# Patient Record
Sex: Male | Born: 1950 | Race: Black or African American | Hispanic: No | Marital: Married | Smoking: Never smoker
Health system: Southern US, Community
[De-identification: ages and names within clinical notes are randomized; demographics above are authoritative.]

## PROBLEM LIST (undated history)

## (undated) DIAGNOSIS — E870 Hyperosmolality and hypernatremia: Secondary | ICD-10-CM

## (undated) DIAGNOSIS — F1010 Alcohol abuse, uncomplicated: Secondary | ICD-10-CM

## (undated) DIAGNOSIS — F0390 Unspecified dementia without behavioral disturbance: Secondary | ICD-10-CM

## (undated) DIAGNOSIS — R569 Unspecified convulsions: Secondary | ICD-10-CM

---

## 2017-12-11 ENCOUNTER — Other Ambulatory Visit: Payer: Self-pay

## 2017-12-11 ENCOUNTER — Emergency Department (HOSPITAL_COMMUNITY): Payer: Medicare Other

## 2017-12-11 ENCOUNTER — Encounter (HOSPITAL_COMMUNITY): Payer: Self-pay | Admitting: *Deleted

## 2017-12-11 ENCOUNTER — Emergency Department (HOSPITAL_COMMUNITY)
Admission: EM | Admit: 2017-12-11 | Discharge: 2017-12-11 | Disposition: A | Discharge status: Home / Self Care | Payer: Medicare Other | Attending: Emergency Medicine | Admitting: Emergency Medicine

## 2017-12-11 DIAGNOSIS — W1830XA Fall on same level, unspecified, initial encounter: Secondary | ICD-10-CM | POA: Insufficient documentation

## 2017-12-11 DIAGNOSIS — Y999 Unspecified external cause status: Secondary | ICD-10-CM | POA: Diagnosis not present

## 2017-12-11 DIAGNOSIS — S01512A Laceration without foreign body of oral cavity, initial encounter: Secondary | ICD-10-CM | POA: Insufficient documentation

## 2017-12-11 DIAGNOSIS — Y939 Activity, unspecified: Secondary | ICD-10-CM | POA: Diagnosis not present

## 2017-12-11 DIAGNOSIS — S01112A Laceration without foreign body of left eyelid and periocular area, initial encounter: Secondary | ICD-10-CM | POA: Insufficient documentation

## 2017-12-11 DIAGNOSIS — R569 Unspecified convulsions: Secondary | ICD-10-CM | POA: Insufficient documentation

## 2017-12-11 DIAGNOSIS — Y92129 Unspecified place in nursing home as the place of occurrence of the external cause: Secondary | ICD-10-CM | POA: Diagnosis not present

## 2017-12-11 DIAGNOSIS — F0390 Unspecified dementia without behavioral disturbance: Secondary | ICD-10-CM | POA: Diagnosis not present

## 2017-12-11 HISTORY — DX: Unspecified convulsions: R56.9

## 2017-12-11 HISTORY — DX: Unspecified dementia, unspecified severity, without behavioral disturbance, psychotic disturbance, mood disturbance, and anxiety: F03.90

## 2017-12-11 HISTORY — DX: Alcohol abuse, uncomplicated: F10.10

## 2017-12-11 HISTORY — DX: Hyperosmolality and hypernatremia: E87.0

## 2017-12-11 LAB — COMPREHENSIVE METABOLIC PANEL
ALT: 16 U/L — ABNORMAL LOW (ref 17–63)
ANION GAP: 10 (ref 5–15)
AST: 28 U/L (ref 15–41)
Albumin: 3.9 g/dL (ref 3.5–5.0)
Alkaline Phosphatase: 65 U/L (ref 38–126)
BUN: 15 mg/dL (ref 6–20)
CALCIUM: 9.1 mg/dL (ref 8.9–10.3)
CHLORIDE: 108 mmol/L (ref 101–111)
CO2: 25 mmol/L (ref 22–32)
Creatinine, Ser: 1.07 mg/dL (ref 0.61–1.24)
Glucose, Bld: 100 mg/dL — ABNORMAL HIGH (ref 65–99)
Potassium: 4.5 mmol/L (ref 3.5–5.1)
SODIUM: 143 mmol/L (ref 135–145)
Total Bilirubin: 0.8 mg/dL (ref 0.3–1.2)
Total Protein: 8.7 g/dL — ABNORMAL HIGH (ref 6.5–8.1)

## 2017-12-11 LAB — CBC WITH DIFFERENTIAL/PLATELET
Basophils Absolute: 0.1 10*3/uL (ref 0.0–0.1)
Basophils Relative: 1 %
EOS ABS: 0.4 10*3/uL (ref 0.0–0.7)
EOS PCT: 5 %
HCT: 39.9 % (ref 39.0–52.0)
Hemoglobin: 13.2 g/dL (ref 13.0–17.0)
LYMPHS ABS: 2 10*3/uL (ref 0.7–4.0)
LYMPHS PCT: 30 %
MCH: 31.3 pg (ref 26.0–34.0)
MCHC: 33.1 g/dL (ref 30.0–36.0)
MCV: 94.5 fL (ref 78.0–100.0)
MONO ABS: 0.6 10*3/uL (ref 0.1–1.0)
MONOS PCT: 10 %
Neutro Abs: 3.7 10*3/uL (ref 1.7–7.7)
Neutrophils Relative %: 54 %
PLATELETS: 173 10*3/uL (ref 150–400)
RBC: 4.22 MIL/uL (ref 4.22–5.81)
RDW: 13.5 % (ref 11.5–15.5)
WBC: 6.8 10*3/uL (ref 4.0–10.5)

## 2017-12-11 MED ORDER — LEVETIRACETAM IN NACL 1000 MG/100ML IV SOLN
1000.0000 mg | Freq: Once | INTRAVENOUS | Status: AC
  Administered 2017-12-11: 1000 mg via INTRAVENOUS
  Filled 2017-12-11: qty 100

## 2017-12-11 NOTE — ED Notes (Signed)
Pt unable to sign due to conditon 

## 2017-12-11 NOTE — ED Triage Notes (Signed)
Pt BIB EMS from [facility] after unwitnessed fall. Pt ? Had a seizure, not verified. Pt was incontinent which is his norm. Pt bite tongue and has lac to left eye lid.  Nursing home stated to EMS he was at his normal state when they arrived. 120/70-80-16-CBG 121

## 2017-12-11 NOTE — ED Provider Notes (Addendum)
[facility]-EMERGENCY DEPT Provider Note   CSN: [id number] Arrival date & time: 12/11/17  0756     History   Chief Complaint Chief Complaint  Patient presents with  . Fall  . Seizures    HPI Iverson AlaminRobert Litsey is a 67 y.o. male.  Chief complaint is seizure  HPI 67 year old male.  History of previous brain injury.  History of seizures.  Resides in extended care facility.  He had a seizure this morning.  He was postictal per staff.  This resolved before arrival of paramedics.  He arrives here awake and alert.  Dementia.  Past Medical History:  Diagnosis Date  . Dementia   . ETOH abuse   . Hypernatremia   . Hyperosmolality   . Seizures (HCC)     There are no active problems to display for this patient.   History reviewed. No pertinent surgical history.     Home Medications    Prior to Admission medications   Medication Sig Start Date End Date Taking? Authorizing Provider  acetaminophen (TYLENOL) 325 MG tablet Take 650 mg by mouth every 6 (six) hours as needed for mild pain, moderate pain, fever or headache.   Yes [provider]  albuterol (PROVENTIL) (2.5 MG/3ML) 0.083% nebulizer solution Take 2.5 mg by nebulization every 6 (six) hours as needed for wheezing or shortness of breath.   Yes [provider]  folic acid (FOLVITE) 1 MG tablet Take 1 mg by mouth daily.   Yes [provider]  levETIRAcetam (KEPPRA) 500 MG tablet Take 500 mg by mouth 3 (three) times daily.   Yes [provider]  Multiple Vitamins-Minerals (MULTIVITAMIN WITH MINERALS) tablet Take 1 tablet by mouth daily.   Yes [provider]  thiamine 100 MG tablet Take 100 mg by mouth daily.   Yes [provider]    Family History No family history on file.  Social History Social History   Tobacco Use  . Smoking status: Never Smoker  . Smokeless tobacco: Never Used  Substance Use Topics  . Alcohol use: Yes  . Drug use: No      Allergies   Patient has no known allergies.   Review of Systems Review of Systems  Unable to perform ROS: Dementia     Physical Exam Updated Vital Signs BP 139/89 (BP Location: Right Arm)   Pulse 76   Temp 97.9 F (36.6 C) (Axillary)   Resp 17   SpO2 100%   Physical Exam  Constitutional: He appears well-developed and well-nourished. No distress.  HENT:  Head: Normocephalic.  Eyes: Conjunctivae are normal. Pupils are equal, round, and reactive to light. No scleral icterus.  Neck: Normal range of motion. Neck supple. No thyromegaly present.  Cardiovascular: Normal rate and regular rhythm. Exam reveals no gallop and no friction rub.  No murmur heard. Pulmonary/Chest: Effort normal and breath sounds normal. No respiratory distress. He has no wheezes. He has no rales.  Abdominal: Soft. Bowel sounds are normal. He exhibits no distension. There is no tenderness. There is no rebound.  Musculoskeletal: Normal range of motion.  Neurological:  Moves all 4 with normal strength and symmetry.  No cranial nerve deficits.  Demented.  Skin: Skin is warm and dry. No rash noted.  Psychiatric: He has a normal mood and affect. His behavior is normal.     ED Treatments / Results  Labs (all labs ordered are listed, but only abnormal results are displayed) Labs Reviewed  COMPREHENSIVE METABOLIC PANEL - Abnormal;  Notable for the following components:      Result Value   Glucose, Bld 100 (*)    Total Protein 8.7 (*)    ALT 16 (*)    All other components within normal limits  CBC WITH DIFFERENTIAL/PLATELET    EKG  EKG Interpretation None       Radiology No results found.  Procedures Procedures (including critical care time)  Medications Ordered in ED Medications  levETIRAcetam (KEPPRA) IVPB 1000 mg/100 mL premix (0 mg Intravenous Stopped 12/11/17 1130)   EKG Interpretation  Sinus rhythm. No ectopy. No ischemia or injury.   Initial Impression / Assessment and Plan /  ED Course  I have reviewed the triage vital signs and the nursing notes.  Pertinent labs & imaging results that were available during my care of the patient were reviewed by me and considered in my medical decision making (see chart for details).    Flow malacia, likely nidus for seizure, on head CT.  No acute findings.  Given IV Keppra.  Appropriate labs.  Discharge back to his facility.  Final Clinical Impressions(s) / ED Diagnoses   Final diagnoses:  Seizure [facility])    ED Discharge Orders    None       Rolland Porter, MD 12/11/17 1457    Rolland Porter, MD 12/30/17 1043

## 2017-12-11 NOTE — ED Notes (Signed)
Attempted to [facility], unable to connect with nurse. [facility] to be called for transport

## 2017-12-11 NOTE — Discharge Instructions (Signed)
Continue Keppra as prescribed. 

## 2018-08-14 ENCOUNTER — Encounter (INDEPENDENT_AMBULATORY_CARE_PROVIDER_SITE_OTHER): Payer: Self-pay | Admitting: Orthopaedic Surgery

## 2018-08-14 ENCOUNTER — Ambulatory Visit (INDEPENDENT_AMBULATORY_CARE_PROVIDER_SITE_OTHER): Payer: Medicare Other

## 2018-08-14 ENCOUNTER — Ambulatory Visit (INDEPENDENT_AMBULATORY_CARE_PROVIDER_SITE_OTHER): Payer: Medicare Other | Admitting: Orthopaedic Surgery

## 2018-08-14 DIAGNOSIS — S62356A Nondisplaced fracture of shaft of fifth metacarpal bone, right hand, initial encounter for closed fracture: Secondary | ICD-10-CM | POA: Diagnosis not present

## 2018-08-14 NOTE — Progress Notes (Signed)
   Office Visit Note   Patient: Thomas AlaminRobert Mcgrath           Date of Birth: 1951/03/02           MRN: [id number] Visit Date: 08/14/2018              Requested by: Renford DillsPolite, [name], MD [address] [place], [place] [address] PCP: Renford DillsPolite, [name], MD   Assessment & Plan: Visit Diagnoses:  1. Closed nondisplaced fracture of shaft of fifth metacarpal bone of right hand, initial encounter     Plan: Impression is right hand fifth metacarpal shaft fracture.  The patient is low demand and we will treat this conservatively.  We will place him in an ulnar gutter cast today.  He will elevate for swelling.  Follow-up with us in 3 weeks time for repeat evaluation x-ray.  Follow-Up Instructions: Return in about 3 weeks (around 09/04/2018).   Orders:  Orders Placed This Encounter  Procedures  . XR Hand Complete Right   No orders of the defined types were placed in this encounter.     Procedures: No procedures performed   Clinical Data: No additional findings.   Subjective: Chief Complaint  Patient presents with  . Right Hand - Pain    HPI patient is a pleasant 67 year old gentleman who comes in today following an injury to the right hand.  [facility] a [facility] with a history of dementia and alcohol abuse.  He is here with his daughter.  He fell this past Sunday possibly landing on the right hand.  He has had pain to the right hand since.  The pain is over the fifth metacarpal.  Worse with movement of the hand and fingers.  He is not wearing any type of splint at today's visit.  Review of Systems as detailed in HPI.  All others reviewed and are negative.   Objective: Vital Signs: There were no vitals taken for this visit.  Physical Exam well-developed and well-nourished gentleman in no acute distress.  Ortho Exam examination of the right hand reveals minimal swelling.  Marked tenderness over the fifth metacarpal shaft with crepitus.  He does have mild  rotation to the fifth phalanx.  Specialty Comments:  No specialty comments available.  Imaging: Xr Hand Complete Right  Result Date: 08/14/2018 Impression is right hand fifth metacarpal shaft fracture    PMFS History: Patient Active Problem List   Diagnosis Date Noted  . Closed nondisplaced fracture of shaft of fifth metacarpal bone of right hand 08/14/2018   Past Medical History:  Diagnosis Date  . Dementia (HCC)   . ETOH abuse   . Hypernatremia   . Hyperosmolality   . Seizures (HCC)     History reviewed. No pertinent family history.  History reviewed. No pertinent surgical history. Social History   Occupational History  . Not on file  Tobacco Use  . Smoking status: Never Smoker  . Smokeless tobacco: Never Used  Substance and Sexual Activity  . Alcohol use: Yes  . Drug use: No  . Sexual activity: Not on file

## 2018-09-04 ENCOUNTER — Ambulatory Visit (INDEPENDENT_AMBULATORY_CARE_PROVIDER_SITE_OTHER): Payer: Medicare Other

## 2018-09-04 ENCOUNTER — Encounter (INDEPENDENT_AMBULATORY_CARE_PROVIDER_SITE_OTHER): Payer: Self-pay | Admitting: Orthopaedic Surgery

## 2018-09-04 ENCOUNTER — Ambulatory Visit (INDEPENDENT_AMBULATORY_CARE_PROVIDER_SITE_OTHER): Payer: Medicare Other | Admitting: Orthopaedic Surgery

## 2018-09-04 DIAGNOSIS — S62356A Nondisplaced fracture of shaft of fifth metacarpal bone, right hand, initial encounter for closed fracture: Secondary | ICD-10-CM | POA: Diagnosis not present

## 2018-09-04 NOTE — Progress Notes (Signed)
   Office Visit Note   Patient: Thomas AlaminRobert Furness           Date of Birth: 1951-04-11           MRN: [id number] Visit Date: 09/04/2018              Requested by: Renford DillsPolite, [name], MD [address] [place], [place] [address] PCP: Renford DillsPolite, [name], MD   Assessment & Plan: Visit Diagnoses:  1. Closed nondisplaced fracture of shaft of fifth metacarpal bone of right hand, initial encounter     Plan: Impression is displaced fifth metacarpal shaft fracture.  We will continue treating this conservatively due to his low demand and history of dementia.  We will place him back in an ulnar gutter cast for another 3 weeks.  Follow-up with us in 3 weeks time for repeat evaluation and x-rays.  Follow-Up Instructions: Return in about 3 weeks (around 09/25/2018).   Orders:  Orders Placed This Encounter  Procedures  . XR Hand Complete Right   No orders of the defined types were placed in this encounter.     Procedures: No procedures performed   Clinical Data: No additional findings.   Subjective: Chief Complaint  Patient presents with  . Right Little Finger - Fracture, Follow-up    HPI patient is a pleasant 67 year old gentleman with dementia who lives [facility] who comes in today for follow-up of his right hand fracture.  He is approximately 3 weeks out displaced fifth metacarpal shaft fracture.  He has been in an ulnar gutter cast over the past few weeks.  He still admits to moderate amount of pain.  He is very low demand and we are treating this conservatively.  Review of Systems as detailed in HPI.  All others reviewed and are negative.   Objective: Vital Signs: There were no vitals taken for this visit.  Physical Exam well-developed well-nourished gentleman in no acute distress.  Alert and oriented x3.  Ortho Exam examination of his right hand reveals minimal swelling.  Moderate tenderness over the fifth metacarpal.  He does exhibit rotation on exam.  He  is neurovascular intact distally.  Specialty Comments:  No specialty comments available.  Imaging: Xr Hand Complete Right  Result Date: 09/04/2018 Stable alignment of the fracture with minimal evidence of healing    PMFS History: Patient Active Problem List   Diagnosis Date Noted  . Closed nondisplaced fracture of shaft of fifth metacarpal bone of right hand 08/14/2018   Past Medical History:  Diagnosis Date  . Dementia (HCC)   . ETOH abuse   . Hypernatremia   . Hyperosmolality   . Seizures (HCC)     History reviewed. No pertinent family history.  History reviewed. No pertinent surgical history. Social History   Occupational History  . Not on file  Tobacco Use  . Smoking status: Never Smoker  . Smokeless tobacco: Never Used  Substance and Sexual Activity  . Alcohol use: Yes  . Drug use: No  . Sexual activity: Not on file

## 2018-10-01 ENCOUNTER — Ambulatory Visit (INDEPENDENT_AMBULATORY_CARE_PROVIDER_SITE_OTHER): Payer: Medicare Other | Admitting: Orthopaedic Surgery

## 2018-10-01 ENCOUNTER — Encounter (INDEPENDENT_AMBULATORY_CARE_PROVIDER_SITE_OTHER): Payer: Self-pay | Admitting: Orthopaedic Surgery

## 2018-10-01 ENCOUNTER — Ambulatory Visit (INDEPENDENT_AMBULATORY_CARE_PROVIDER_SITE_OTHER): Payer: Medicare Other

## 2018-10-01 DIAGNOSIS — S62356D Nondisplaced fracture of shaft of fifth metacarpal bone, right hand, subsequent encounter for fracture with routine healing: Secondary | ICD-10-CM

## 2018-10-01 NOTE — Progress Notes (Signed)
   Post-Op Visit Note   Patient: Thomas Mcgrath           Date of Birth: [birth year]           MRN: [id number] Visit Date: 10/01/2018 PCP: Renford Dills, MD   Assessment & Plan:  Chief Complaint:  Chief Complaint  Patient presents with  . Right Hand - Follow-up   Visit Diagnoses:  1. Closed nondisplaced fracture of shaft of fifth metacarpal bone of right hand with routine healing, subsequent encounter     Plan: Jorge is 6 weeks status post minimally displaced right fifth metacarpal neck fracture.  He comes in today for follow-up.  He has advanced dementia and is wheelchair-bound.  He reports no pain at rest.  He does have slight stiffness in his right hand as expected from 6 weeks of casting.  X-rays demonstrate continued healing of the fracture with stable alignment.  At this point he can begin hand therapy.  We will place him in a removable ulnar gutter brace which he can wean as tolerated.  Recheck in 6 weeks with three-view x-rays of the right hand.  Follow-Up Instructions: Return in about 6 weeks (around 11/12/2018).   Orders:  Orders Placed This Encounter  Procedures  . XR Hand Complete Right   No orders of the defined types were placed in this encounter.   Imaging: Xr Hand Complete Right  Result Date: 10/01/2018 Stable alignment of fifth metacarpal fracture.  Fracture appears to be healing with early callus formation   PMFS History: Patient Active Problem List   Diagnosis Date Noted  . Closed nondisplaced fracture of shaft of fifth metacarpal bone of right hand 08/14/2018   Past Medical History:  Diagnosis Date  . Dementia (HCC)   . ETOH abuse   . Hypernatremia   . Hyperosmolality   . Seizures (HCC)     History reviewed. No pertinent family history.  History reviewed. No pertinent surgical history. Social History   Occupational History  . Not on file  Tobacco Use  . Smoking status: Never Smoker  . Smokeless tobacco: Never Used  Substance and Sexual  Activity  . Alcohol use: Yes  . Drug use: No  . Sexual activity: Not on file

## 2018-10-07 ENCOUNTER — Telehealth (INDEPENDENT_AMBULATORY_CARE_PROVIDER_SITE_OTHER): Payer: Self-pay | Admitting: Orthopaedic Surgery

## 2018-10-07 NOTE — Telephone Encounter (Signed)
Please advise 

## 2018-10-07 NOTE — Telephone Encounter (Signed)
[facility] and any precautions. Callback [phone], fax [phone]

## 2018-10-07 NOTE — Telephone Encounter (Signed)
ROM as tolerated.  May begin strengthening

## 2018-10-08 NOTE — Telephone Encounter (Signed)
FAXED TO [facility]

## 2018-11-12 ENCOUNTER — Ambulatory Visit (INDEPENDENT_AMBULATORY_CARE_PROVIDER_SITE_OTHER): Payer: Medicare Other | Admitting: Orthopaedic Surgery

## 2019-02-21 ENCOUNTER — Inpatient Hospital Stay (HOSPITAL_COMMUNITY)
Admission: EM | Admit: 2019-02-21 | Discharge: 2019-03-25 | DRG: 871 | Disposition: E | Payer: Medicare Other | Attending: Internal Medicine | Admitting: Internal Medicine

## 2019-02-21 ENCOUNTER — Other Ambulatory Visit: Payer: Self-pay

## 2019-02-21 ENCOUNTER — Emergency Department (HOSPITAL_COMMUNITY): Payer: Medicare Other

## 2019-02-21 DIAGNOSIS — J9601 Acute respiratory failure with hypoxia: Secondary | ICD-10-CM | POA: Diagnosis present

## 2019-02-21 DIAGNOSIS — J1289 Other viral pneumonia: Secondary | ICD-10-CM | POA: Diagnosis present

## 2019-02-21 DIAGNOSIS — J069 Acute upper respiratory infection, unspecified: Secondary | ICD-10-CM | POA: Diagnosis not present

## 2019-02-21 DIAGNOSIS — A4189 Other specified sepsis: Principal | ICD-10-CM | POA: Diagnosis present

## 2019-02-21 DIAGNOSIS — Z515 Encounter for palliative care: Secondary | ICD-10-CM | POA: Diagnosis not present

## 2019-02-21 DIAGNOSIS — G40909 Epilepsy, unspecified, not intractable, without status epilepticus: Secondary | ICD-10-CM | POA: Diagnosis present

## 2019-02-21 DIAGNOSIS — J129 Viral pneumonia, unspecified: Secondary | ICD-10-CM | POA: Diagnosis present

## 2019-02-21 DIAGNOSIS — E872 Acidosis: Secondary | ICD-10-CM | POA: Diagnosis present

## 2019-02-21 DIAGNOSIS — Z79899 Other long term (current) drug therapy: Secondary | ICD-10-CM

## 2019-02-21 DIAGNOSIS — F0390 Unspecified dementia without behavioral disturbance: Secondary | ICD-10-CM | POA: Diagnosis present

## 2019-02-21 DIAGNOSIS — Z66 Do not resuscitate: Secondary | ICD-10-CM | POA: Diagnosis present

## 2019-02-21 DIAGNOSIS — U071 COVID-19: Secondary | ICD-10-CM | POA: Diagnosis present

## 2019-02-21 DIAGNOSIS — I509 Heart failure, unspecified: Secondary | ICD-10-CM | POA: Diagnosis not present

## 2019-02-21 DIAGNOSIS — R0602 Shortness of breath: Secondary | ICD-10-CM

## 2019-02-21 LAB — COMPREHENSIVE METABOLIC PANEL
ALT: 22 U/L (ref 0–44)
AST: 77 U/L — ABNORMAL HIGH (ref 15–41)
Albumin: 3.2 g/dL — ABNORMAL LOW (ref 3.5–5.0)
Alkaline Phosphatase: 72 U/L (ref 38–126)
Anion gap: 19 — ABNORMAL HIGH (ref 5–15)
BUN: 10 mg/dL (ref 8–23)
CO2: 15 mmol/L — ABNORMAL LOW (ref 22–32)
Calcium: 8.3 mg/dL — ABNORMAL LOW (ref 8.9–10.3)
Chloride: 106 mmol/L (ref 98–111)
Creatinine, Ser: 1.36 mg/dL — ABNORMAL HIGH (ref 0.61–1.24)
GFR calc Af Amer: >60 mL/min (ref >60)
GFR calc non Af Amer: 53 mL/min — ABNORMAL LOW (ref >60)
Glucose, Bld: 174 mg/dL — ABNORMAL HIGH (ref 70–99)
Potassium: 5.5 mmol/L — ABNORMAL HIGH (ref 3.5–5.1)
Sodium: 140 mmol/L (ref 135–145)
Total Bilirubin: 2.4 mg/dL — ABNORMAL HIGH (ref 0.3–1.2)
Total Protein: 8.1 g/dL (ref 6.5–8.1)

## 2019-02-21 LAB — POCT I-STAT 7, (LYTES, BLD GAS, ICA,H+H)
Acid-base deficit: 10 mmol/L — ABNORMAL HIGH (ref 0.0–2.0)
Bicarbonate: 13.9 mmol/L — ABNORMAL LOW (ref 20.0–28.0)
Calcium, Ion: 1.12 mmol/L — ABNORMAL LOW (ref 1.15–1.40)
HCT: 40 % (ref 39.0–52.0)
Hemoglobin: 13.6 g/dL (ref 13.0–17.0)
O2 Saturation: 96 %
Patient temperature: 98.6
Potassium: 3.6 mmol/L (ref 3.5–5.1)
Sodium: 143 mmol/L (ref 135–145)
TCO2: 15 mmol/L — ABNORMAL LOW (ref 22–32)
pCO2 arterial: 26.1 mmHg — ABNORMAL LOW (ref 32.0–48.0)
pH, Arterial: 7.333 — ABNORMAL LOW (ref 7.350–7.450)
pO2, Arterial: 89 mmHg (ref 83.0–108.0)

## 2019-02-21 LAB — CBC WITH DIFFERENTIAL/PLATELET
Abs Immature Granulocytes: 0.05 10*3/uL (ref 0.00–0.07)
Basophils Absolute: 0 10*3/uL (ref 0.0–0.1)
Basophils Relative: 0 %
Eosinophils Absolute: 0.1 10*3/uL (ref 0.0–0.5)
Eosinophils Relative: 0 %
HCT: 44 % (ref 39.0–52.0)
Hemoglobin: 13.7 g/dL (ref 13.0–17.0)
Immature Granulocytes: 0 %
Lymphocytes Relative: 12 %
Lymphs Abs: 1.3 10*3/uL (ref 0.7–4.0)
MCH: 29.9 pg (ref 26.0–34.0)
MCHC: 31.1 g/dL (ref 30.0–36.0)
MCV: 96.1 fL (ref 80.0–100.0)
Monocytes Absolute: 0.3 10*3/uL (ref 0.1–1.0)
Monocytes Relative: 3 %
Neutro Abs: 9.7 10*3/uL — ABNORMAL HIGH (ref 1.7–7.7)
Neutrophils Relative %: 85 %
Platelets: 169 10*3/uL (ref 150–400)
RBC: 4.58 MIL/uL (ref 4.22–5.81)
RDW: 14.1 % (ref 11.5–15.5)
WBC: 11.3 10*3/uL — ABNORMAL HIGH (ref 4.0–10.5)
nRBC: 0 % (ref 0.0–0.2)

## 2019-02-21 LAB — SARS CORONAVIRUS 2 BY RT PCR (HOSPITAL ORDER, PERFORMED IN ~~LOC~~ HOSPITAL LAB): SARS Coronavirus 2: POSITIVE — AB

## 2019-02-21 LAB — LACTIC ACID, PLASMA: Lactic Acid, Venous: 9.5 mmol/L (ref 0.5–1.9)

## 2019-02-21 MED ORDER — LEVETIRACETAM 500 MG PO TABS
500.0000 mg | ORAL_TABLET | Freq: Three times a day (TID) | ORAL | Status: DC
  Filled 2019-02-21 (×2): qty 1

## 2019-02-21 MED ORDER — HEPARIN SODIUM (PORCINE) 5000 UNIT/ML IJ SOLN: 5000.0000 [IU] | Freq: Three times a day (TID) | INTRAMUSCULAR | Status: DC

## 2019-02-21 MED ORDER — ACETAMINOPHEN 325 MG PO TABS: 650.0000 mg | ORAL_TABLET | Freq: Four times a day (QID) | ORAL | Status: DC | PRN

## 2019-02-21 MED ORDER — FOLIC ACID 1 MG PO TABS: 1.0000 mg | ORAL_TABLET | Freq: Every day | ORAL | Status: DC

## 2019-02-21 MED ORDER — SODIUM CHLORIDE 0.9 % IV SOLN
INTRAVENOUS | Status: DC
  Administered 2019-02-22: 01:00:00 via INTRAVENOUS

## 2019-02-21 MED ORDER — ZINC SULFATE 220 (50 ZN) MG PO CAPS: 220.0000 mg | ORAL_CAPSULE | Freq: Every day | ORAL | Status: DC

## 2019-02-21 MED ORDER — HYDROCOD POLST-CPM POLST ER 10-8 MG/5ML PO SUER: 5.0000 mL | Freq: Two times a day (BID) | ORAL | Status: DC | PRN

## 2019-02-21 MED ORDER — ADULT MULTIVITAMIN W/MINERALS CH: 1.0000 | ORAL_TABLET | Freq: Every day | ORAL | Status: DC

## 2019-02-21 MED ORDER — SODIUM CHLORIDE 0.9 % IV BOLUS
1000.0000 mL | Freq: Once | INTRAVENOUS | Status: AC
  Administered 2019-02-21: 1000 mL via INTRAVENOUS

## 2019-02-21 MED ORDER — GUAIFENESIN-DM 100-10 MG/5ML PO SYRP: 10.0000 mL | ORAL_SOLUTION | ORAL | Status: DC | PRN

## 2019-02-21 MED ORDER — VITAMIN C 500 MG PO TABS: 500.0000 mg | ORAL_TABLET | Freq: Every day | ORAL | Status: DC

## 2019-02-21 MED ORDER — ACETAMINOPHEN 650 MG RE SUPP
650.0000 mg | Freq: Once | RECTAL | Status: AC
  Administered 2019-02-21: 650 mg via RECTAL
  Filled 2019-02-21: qty 1

## 2019-02-21 MED ORDER — VANCOMYCIN HCL 10 G IV SOLR
1500.0000 mg | Freq: Once | INTRAVENOUS | Status: AC
  Administered 2019-02-21: 1500 mg via INTRAVENOUS
  Filled 2019-02-21: qty 1500

## 2019-02-21 MED ORDER — IPRATROPIUM-ALBUTEROL 20-100 MCG/ACT IN AERS
1.0000 | INHALATION_SPRAY | Freq: Four times a day (QID) | RESPIRATORY_TRACT | Status: DC
  Filled 2019-02-21: qty 4

## 2019-02-21 MED ORDER — PIPERACILLIN-TAZOBACTAM 3.375 G IVPB 30 MIN
3.3750 g | Freq: Once | INTRAVENOUS | Status: AC
  Administered 2019-02-21: 3.375 g via INTRAVENOUS
  Filled 2019-02-21: qty 50

## 2019-02-21 MED ORDER — METHYLPREDNISOLONE SODIUM SUCC 40 MG IJ SOLR
40.0000 mg | Freq: Three times a day (TID) | INTRAMUSCULAR | Status: DC
  Administered 2019-02-22 – 2019-02-23 (×4): 40 mg via INTRAVENOUS
  Filled 2019-02-21 (×4): qty 1

## 2019-02-21 MED ORDER — SODIUM CHLORIDE 0.9% FLUSH
3.0000 mL | Freq: Two times a day (BID) | INTRAVENOUS | Status: DC
  Administered 2019-02-22 – 2019-02-24 (×3): 3 mL via INTRAVENOUS

## 2019-02-21 MED ORDER — VITAMIN B-1 100 MG PO TABS: 100.0000 mg | ORAL_TABLET | Freq: Every day | ORAL | Status: DC

## 2019-02-21 NOTE — H&P (Signed)
History and Physical    Chord Takahashi DGU:[id number] DOB: 1951/05/06 DOA: 02/08/2019  PCP: Seward Carol, MD  Patient coming from: Nursing facility  I have personally briefly reviewed patient's old medical records in [place]  Chief Complaint: Respiratory distress  HPI: Thomas Mcgrath is a 68 y.o. male with medical history significant for advanced dementia, seizure disorder, alcohol use who was brought to the ED via EMS from [place] facility for respiratory distress.  Patient is unable to provide any history due to respiratory distress therefore entirety of history is obtained from EDP, chart review, and daughter by phone.  Patient was apparently diagnosed with positive COVID-19 test at his facility and isolated from other residents.  Over the last 24 hours he had progressive shortness of breath, respiratory distress, and fever.  EMS were called today and he was found to have a oxygen saturation in the 40s which was improved with CPAP.  He is brought to the ED on nonrebreather with O2 sats in the mid to upper 80s.  Patient is DNR/DNI, confirmed with his daughter by phone and gold form at bedside.  ED Course:  Initial vitals showed BP 110/58, pulse 121, RR 31, temp 103.2 Fahrenheit rectally, SPO2 99% on nonrebreather.  Labs are notable for WBC 11.3, hemoglobin 13.7, platelet 169,000, 9.7 absolute neutrophils, 1.3 absolute lymphocytes.  Sodium 140, potassium 5.5, bicarb 15, serum glucose 174, BUN 10, creatinine 1.36, AST 77, ALT 22, alk phos 72, T bili 2.4, anion gap 19, lactic acid 9.5.  ABG showed pH 7.333, PCO2 26.1, PO2 89.0.  SARS-CoV-2 test is positive.  Blood cultures were drawn.  Portable chest x-ray showed diffuse bilateral infiltrates.  Patient was given 2 L normal saline and started on IV vancomycin and Zosyn.  The hospitalist service was consulted to admit for acute respiratory failure due to COVID-19 infection.  Review of Systems:  Unable to obtain full review  of systems due to acuity of condition.  Past Medical History:  Diagnosis Date  . Dementia ([place])   . ETOH abuse   . Hypernatremia   . Hyperosmolality   . Seizures ([place])     No past surgical history on file.  Social History:  reports that he has never smoked. He has never used smokeless tobacco. He reports current alcohol use. He reports that he does not use drugs.  No Known Allergies  No family history on file.   Prior to Admission medications   Medication Sig Start Date End Date Taking? Authorizing Provider  acetaminophen (TYLENOL) 325 MG tablet Take 650 mg by mouth every 6 (six) hours as needed for mild pain, moderate pain, fever or headache.    [provider]  albuterol (PROVENTIL) (2.5 MG/3ML) 0.083% nebulizer solution Take 2.5 mg by nebulization every 6 (six) hours as needed for wheezing or shortness of breath.    [provider]  folic acid (FOLVITE) 1 MG tablet Take 1 mg by mouth daily.    [provider]  levETIRAcetam (KEPPRA) 500 MG tablet Take 500 mg by mouth 3 (three) times daily.    [provider]  Multiple Vitamins-Minerals (MULTIVITAMIN WITH MINERALS) tablet Take 1 tablet by mouth daily.    [provider]  thiamine 100 MG tablet Take 100 mg by mouth daily.    [provider]    Physical Exam: Vitals:   01/28/2019 2215 02/06/2019 2300 02/03/2019 2301 01/30/2019 2345  BP: 96/70 102/76 102/76 101/77  Pulse: (!) 106 (!) 105 (!) 105  Resp: (!) 32 (!) 31 (!) 28 (!) 30  Temp:      TempSrc:      SpO2: 96% 100% 95%     Constitutional: Elderly man resting supine in bed on nonrebreather, appears tired. Eyes: PERRL, lids and conjunctivae normal ENMT: Mucous membranes are dry. Neck: normal, supple, no masses. Respiratory: Coarse inspiratory and expiratory wheezing bilateral lung fields anteriorly.  Increased work of breathing and tachypnea. Cardiovascular: Tachycardic, no murmurs / rubs / gallops. No extremity edema. 2+  pedal pulses. Abdomen: no tenderness, no masses palpated. No hepatosplenomegaly. Bowel sounds positive.  Musculoskeletal: no clubbing / cyanosis. No joint deformity upper and lower extremities. No contractures. Normal muscle tone.  Skin: no rashes, lesions, ulcers. No induration Neurologic: Moving all extremities spontaneously, altered. Psychiatric: Somnolent, intermittently awakens to voice.  Labs on Admission: I have personally reviewed following labs and imaging studies  CBC: Recent Labs  Lab 02/07/2019 1936 01/23/2019 2000  WBC 11.3*  --   NEUTROABS 9.7*  --   HGB 13.7 13.6  HCT 44.0 40.0  MCV 96.1  --   PLT 169  --    Basic Metabolic Panel: Recent Labs  Lab 02/18/2019 1936 02/12/2019 2000  NA 140 143  K 5.5* 3.6  CL 106  --   CO2 15*  --   GLUCOSE 174*  --   BUN 10  --   CREATININE 1.36*  --   CALCIUM 8.3*  --    GFR: CrCl cannot be calculated (Unknown ideal weight.). Liver Function Tests: Recent Labs  Lab 01/28/2019 1936  AST 77*  ALT 22  ALKPHOS 72  BILITOT 2.4*  PROT 8.1  ALBUMIN 3.2*   No results for input(s): LIPASE, AMYLASE in the last 168 hours. No results for input(s): AMMONIA in the last 168 hours. Coagulation Profile: No results for input(s): INR, PROTIME in the last 168 hours. Cardiac Enzymes: No results for input(s): CKTOTAL, CKMB, CKMBINDEX, TROPONINI in the last 168 hours. BNP (last 3 results) No results for input(s): PROBNP in the last 8760 hours. HbA1C: No results for input(s): HGBA1C in the last 72 hours. CBG: No results for input(s): GLUCAP in the last 168 hours. Lipid Profile: No results for input(s): CHOL, HDL, LDLCALC, TRIG, CHOLHDL, LDLDIRECT in the last 72 hours. Thyroid Function Tests: No results for input(s): TSH, T4TOTAL, FREET4, T3FREE, THYROIDAB in the last 72 hours. Anemia Panel: No results for input(s): VITAMINB12, FOLATE, FERRITIN, TIBC, IRON, RETICCTPCT in the last 72 hours. Urine analysis: No results found for: COLORURINE,  APPEARANCEUR, LABSPEC, PHURINE, GLUCOSEU, HGBUR, BILIRUBINUR, KETONESUR, PROTEINUR, UROBILINOGEN, NITRITE, LEUKOCYTESUR  Radiological Exams on Admission: Dg Chest Port 1 View  Result Date: 02/10/2019 CLINICAL DATA:  Shortness of breath EXAM: PORTABLE CHEST 1 VIEW COMPARISON:  None. FINDINGS: Diffuse bilateral airspace disease noted. Heart is normal size. No effusions. No acute bony abnormality. IMPRESSION: Diffuse bilateral airspace disease, likely infection. Electronically Signed   By: Rolm Baptise M.D.   On: 02/05/2019 22:36    EKG: Independently reviewed. Sinus tachycardia, rate 134, motion artifact present.  Assessment/Plan Principal Problem:   Acute respiratory disease due to COVID-19 virus Active Problems:   Seizure disorder [facility]  Carsen Leaf is a 68 y.o. male with medical history significant for advanced dementia, seizure disorder, alcohol use who incidentally with sepsis and acute hypoxic respiratory failure due to COVID-19 infection.   Sepsis and acute respiratory failure with hypoxia due to COVID-19 infection with multifocal pneumonia: Chest x-ray with diffuse bilateral infiltrates, concerning for ARDS type  picture.  Oxygen saturation improving on nonrebreather, however he remains tachypneic and tachycardic with lactic acidosis of 9.5. -Admit to [facility] progressive care unit -Continue nonrebreather, wean as able -Obtain baseline COVID labs -Continue IV fluid resuscitation, repeat lactic acid -Start IV Solu-Medrol 40 mg q8h -s/p Vanc/Zosyn in ED - hold further abx due to most likely viral cause -Patient is DNR/DNI, discussed with daughter would not escalate care.  Low threshold to transition to comfort care.  Seizure disorder: -Continue Keppra  Goals of care: Prognosis is poor.  Discussed situation with daughter.  She confirmed he is DNR/DNI.  Continue supportive care for now, would not escalate care. She is agreeable to transition to comfort care if no significant  improvement with initial management.  DVT prophylaxis: Subcutaneous heparin Code Status: DNR/DNI, confirmed with patient's daughter Family Communication: Discussed with daughter Aurora Mask - [phone] Disposition Plan: Admit to [place] called: None Admission status: Inpatient, patient requires greater than 2 midnight length stay for sepsis, acute hypoxic respiratory failure due to COVID-19 infection as he is severely ill and high risk for further decompensation.  Zada Finders MD [facility]  If 7PM-7AM, please contact night-coverage [email or web address]  02/13/2019, 11:57 PM

## 2019-02-21 NOTE — ED Notes (Addendum)
Spoke w/ patient's daughter Clarisse Gouge @[phone]; updates given regarding current status; will call again for more updated disposition.

## 2019-02-21 NOTE — ED Notes (Signed)
ED TO INPATIENT HANDOFF REPORT  ED Nurse Name and Phone #: Magnus IvanLouie RN [phone]  S Name/Age/Gender Thomas Mcgrath 68 y.o. male Room/Bed: RESUSC/RESUSC  Code Status   Code Status: Not on file  Home/SNF/Other Nursing Home Patient oriented to: self, place and situation Is this baseline? Yes   Triage Complete: Triage complete  Chief Complaint Covid +/ Resp Distress  Triage Note Came in via ems; reported hx of Covid +; /o respiratory distress    Allergies No Known Allergies  Level of Care/Admitting Diagnosis ED Disposition    ED Disposition Condition Comment   Admit  [facility] [[id number]]  Level of Care: Progressive [102]  Covid Evaluation: Confirmed COVID Positive  Isolation Risk Level: Comment  Comment: >6L O2 requirement, on NRB  Diagnosis: Acute respiratory disease due to COVID-19 virus [[id number]][[phone]]  Admitting Physician: Charlsie QuestPATEL, VISHAL R [[id number]]  Attending Physician: Charlsie QuestPATEL, VISHAL R [[id number]]  Estimated length of stay: past midnight tomorrow  Certification:: I certify this patient will need inpatient services for at least 2 midnights  PT Class (Do Not Modify): Inpatient [101]  PT Acc Code (Do Not Modify): Private [1]       B Medical/Surgery History Past Medical History:  Diagnosis Date  . Dementia (HCC)   . ETOH abuse   . Hypernatremia   . Hyperosmolality   . Seizures (HCC)    No past surgical history on file.   A IV Location/Drains/Wounds Patient Lines/Drains/Airways Status   Active Line/Drains/Airways    Name:   Placement date:   Placement time:   Site:   Days:   Peripheral IV 12/11/17 Right Antecubital   12/11/17    0916    Antecubital   437   Peripheral IV 02/18/2019 Right Arm   02/01/2019    1930    Arm   less than 1          Intake/Output Last 24 hours No intake or output data in the 24 hours ending 02/08/2019 2323  Labs/Imaging Results for orders placed or performed during the hospital encounter of 02/16/2019 (from  the past 48 hour(s))  CBC with Differential     Status: Abnormal   Collection Time: 02/12/2019  7:36 PM  Result Value Ref Range   WBC 11.3 (H) 4.0 - 10.5 K/uL   RBC 4.58 4.22 - 5.81 MIL/uL   Hemoglobin 13.7 13.0 - 17.0 g/dL   HCT 78.444.0 69.639.0 - 29.552.0 %   MCV 96.1 80.0 - 100.0 fL   MCH 29.9 26.0 - 34.0 pg   MCHC 31.1 30.0 - 36.0 g/dL   RDW 28.414.1 13.211.5 - 44.015.5 %   Platelets 169 150 - 400 K/uL   nRBC 0.0 0.0 - 0.2 %   Neutrophils Relative % 85 %   Neutro Abs 9.7 (H) 1.7 - 7.7 K/uL   Lymphocytes Relative 12 %   Lymphs Abs 1.3 0.7 - 4.0 K/uL   Monocytes Relative 3 %   Monocytes Absolute 0.3 0.1 - 1.0 K/uL   Eosinophils Relative 0 %   Eosinophils Absolute 0.1 0.0 - 0.5 K/uL   Basophils Relative 0 %   Basophils Absolute 0.0 0.0 - 0.1 K/uL   Immature Granulocytes 0 %   Abs Immature Granulocytes 0.05 0.00 - 0.07 K/uL   Tear Drop Cells PRESENT     Comment: Performed at [facility], [address] [place], [place] [address]  Comprehensive metabolic panel     Status: Abnormal   Collection Time: 02/22/2019  7:36 PM  Result Value Ref Range   Sodium 140 135 - 145 mmol/L   Potassium 5.5 (H) 3.5 - 5.1 mmol/L   Chloride 106 98 - 111 mmol/L   CO2 15 (L) 22 - 32 mmol/L   Glucose, Bld 174 (H) 70 - 99 mg/dL   BUN 10 8 - 23 mg/dL   Creatinine, Ser 1.61 (H) 0.61 - 1.24 mg/dL   Calcium 8.3 (L) 8.9 - 10.3 mg/dL   Total Protein 8.1 6.5 - 8.1 g/dL   Albumin 3.2 (L) 3.5 - 5.0 g/dL   AST 77 (H) 15 - 41 U/L   ALT 22 0 - 44 U/L   Alkaline Phosphatase 72 38 - 126 U/L   Total Bilirubin 2.4 (H) 0.3 - 1.2 mg/dL   GFR calc non Af Amer 53 (L) >60 mL/min   GFR calc Af Amer >60 >60 mL/min   Anion gap 19 (H) 5 - 15    Comment: Performed at [facility], [address] [place], [place] [address]  Lactic acid, plasma     Status: Abnormal   Collection Time: 02/05/2019  7:37 PM  Result Value Ref Range   Lactic Acid, Venous 9.5 (HH) 0.5 - 1.9 mmol/L    Comment: CRITICAL RESULT CALLED TO, READ BACK BY AND  VERIFIED WITH: CHILTON,L RN 01/29/2019 2231 JORDANS Performed at [facility], [address] [place], [place] [address]   I-STAT 7, (LYTES, BLD GAS, ICA, H+H)     Status: Abnormal   Collection Time: 02/20/2019  8:00 PM  Result Value Ref Range   pH, Arterial 7.333 (L) 7.350 - 7.450   pCO2 arterial 26.1 (L) 32.0 - 48.0 mmHg   pO2, Arterial 89.0 83.0 - 108.0 mmHg   Bicarbonate 13.9 (L) 20.0 - 28.0 mmol/L   TCO2 15 (L) 22 - 32 mmol/L   O2 Saturation 96.0 %   Acid-base deficit 10.0 (H) 0.0 - 2.0 mmol/L   Sodium 143 135 - 145 mmol/L   Potassium 3.6 3.5 - 5.1 mmol/L   Calcium, Ion 1.12 (L) 1.15 - 1.40 mmol/L   HCT 40.0 39.0 - 52.0 %   Hemoglobin 13.6 13.0 - 17.0 g/dL   Patient temperature 11.9 F    Collection site RADIAL, ALLEN'S TEST ACCEPTABLE    Drawn by RT    Sample type ARTERIAL   SARS Coronavirus 2 (CEPHEID- Performed in [facility] hospital lab), Hosp Order     Status: Abnormal   Collection Time: 02/08/2019  8:04 PM  Result Value Ref Range   SARS Coronavirus 2 POSITIVE (A) NEGATIVE    Comment: RESULT CALLED TO, READ BACK BY AND VERIFIED WITH: RN L CHILTON @ 2155 02/20/2019 BY S GEZAHEGN (NOTE) If result is NEGATIVE SARS-CoV-2 target nucleic acids are NOT DETECTED. The SARS-CoV-2 RNA is generally detectable in upper and lower  respiratory specimens during the acute phase of infection. The lowest  concentration of SARS-CoV-2 viral copies this assay can detect is 250  copies / mL. A negative result does not preclude SARS-CoV-2 infection  and should not be used as the sole basis for treatment or other  patient management decisions.  A negative result may occur with  improper specimen collection / handling, submission of specimen other  than nasopharyngeal swab, presence of viral mutation(s) within the  areas targeted by this assay, and inadequate number of viral copies  (<250 copies / mL). A negative result must be combined with clinical  observations, patient history, and  epidemiological information. If result is POSITIVE SARS-CoV-2 target nucleic acids are DETECT ED. The SARS-CoV-2 RNA is generally detectable in upper and lower  respiratory specimens during the acute phase of infection.  Positive  results are indicative of active infection with SARS-CoV-2.  Clinical  correlation with patient history and other diagnostic information is  necessary to determine patient infection status.  Positive results do  not rule out bacterial infection or co-infection with other viruses. If result is PRESUMPTIVE POSTIVE SARS-CoV-2 nucleic acids MAY BE PRESENT.   A presumptive positive result was obtained on the submitted specimen  and confirmed on repeat testing.  While 2019 novel coronavirus  (SARS-CoV-2) nucleic acids may be present in the submitted sample  additional confirmatory testing may be necessary for epidemiological  and / or clinical management purposes  to differentiate between  SARS-CoV-2 and other Sarbecovirus currently known to infect humans.  If clinically indicated additional testing with an alternate test  methodology (LAB74 53) is advised. The SARS-CoV-2 RNA is generally  detectable in upper and lower respiratory specimens during the acute  phase of infection. The expected result is Negative. Fact Sheet for Patients:  BoilerBrush.com.cy Fact Sheet for Healthcare Providers: https://pope.com/ This test is not yet approved or cleared by the [place] FDA and has been authorized for detection and/or diagnosis of SARS-CoV-2 by FDA under an Emergency Use Authorization (EUA).  This EUA will remain in effect (meaning this test can be used) for the duration of the COVID-19 declaration under Section 564(b)(1) of the Act, 21 U.S.C. section 360bbb-3(b)(1), unless the authorization is terminated or revoked sooner. Performed at [facility], [address], [place], [place] [address]    Dg Chest  Port 1 View  Result Date: 01/27/2019 CLINICAL DATA:  Shortness of breath EXAM: PORTABLE CHEST 1 VIEW COMPARISON:  None. FINDINGS: Diffuse bilateral airspace disease noted. Heart is normal size. No effusions. No acute bony abnormality. IMPRESSION: Diffuse bilateral airspace disease, likely infection. Electronically Signed   By: Charlett Nose M.D.   On: 02/18/2019 22:36    Pending Labs Unresulted Labs (From admission, onward)    Start     Ordered   02/16/2019 1937  Lactic acid, plasma  Now then every 2 hours,   STAT     02/16/2019 1936   01/24/2019 1937  Blood gas, arterial  Once,   STAT     02/14/2019 1936   01/26/2019 1937  Blood culture (routine x 2)  BLOOD CULTURE X 2,   STAT     01/25/2019 1936          Vitals/Pain Today's Vitals   02/08/2019 2115 02/07/2019 2200 02/01/2019 2215 02/20/2019 2301  BP: (!) 105/94 (!) 81/63 96/70 102/76  Pulse: (!) 112 (!) 107 (!) 106 (!) 105  Resp: (!) 28 (!) 41 (!) 32 (!) 28  Temp:      TempSrc:      SpO2: 100% 97% 96% 95%    Isolation Precautions Droplet and Contact precautions  Medications Medications  vancomycin (VANCOCIN) 1,500 mg in sodium chloride 0.9 % 500 mL IVPB (1,500 mg Intravenous New Bag/Given 02/07/2019 2316)  piperacillin-tazobactam (ZOSYN) IVPB 3.375 g (3.375 g Intravenous New Bag/Given 01/31/2019 2320)  acetaminophen (TYLENOL) suppository 650 mg (650 mg Rectal Given 02/22/2019 1955)  sodium chloride 0.9 % bolus 1,000 mL (1,000 mLs Intravenous New Bag/Given 02/14/2019 2008)  sodium chloride 0.9 % bolus 1,000 mL (1,000 mLs Intravenous New Bag/Given 02/09/2019 2320)    Mobility non-ambulatory High fall risk  Focused Assessments Pulmonary Assessment Handoff:  Lung sounds: L Breath Sounds: Diminished R Breath Sounds: Diminished O2 Device: NRB        R Recommendations: See Admitting Provider Note  Report given to:   Additional Notes: COVID + from [place] for respiratory distress pt is a DNR DNI,

## 2019-02-21 NOTE — ED Triage Notes (Signed)
Came in via ems; reported hx of Covid +; /o respiratory distress

## 2019-02-21 NOTE — ED Notes (Signed)
Pt unable to sign consent form, this nurse and louie RN signed and witnessed consent

## 2019-02-21 NOTE — ED Provider Notes (Signed)
[id number] Arrival date & time: 02/17/2019  1926    History   Chief Complaint Chief Complaint  Patient presents with  . Respiratory Distress    HPI Thomas Mcgrath is a 68 y.o. male.     68 yo M with a chief complaint of shortness of breath.  The patient has been diagnosed with the coronavirus at the nursing home in which he lives.  He has been isolated from the other residents.  Unfortunately over the past 24 hours he has had worsening shortness of breath.  EMS was called.  Found to have oxygen saturation in the 40s.  Improved on CPAP.  Was in the mid to upper 80s on nonrebreather on arrival.  Level 5 caveat respiratory distress.  The history is provided by the patient.  Illness  Severity:  Moderate Onset quality:  Sudden Duration:  1 week Timing:  Constant Progression:  Worsening Chronicity:  New Associated symptoms: shortness of breath   Associated symptoms: no abdominal pain, no chest pain, no congestion, no diarrhea, no fever, no headaches, no myalgias, no rash and no vomiting     Past Medical History:  Diagnosis Date  . Dementia (HCC)   . ETOH abuse   . Hypernatremia   . Hyperosmolality   . Seizures [facility])     Patient Active Problem List   Diagnosis Date Noted  . Closed nondisplaced fracture of shaft of fifth metacarpal bone of right hand 08/14/2018    No past surgical history on file.      Home Medications    Prior to Admission medications   Medication Sig Start Date End Date Taking? Authorizing Provider  acetaminophen (TYLENOL) 325 MG tablet Take 650 mg by mouth every 6 (six) hours as needed for mild pain, moderate pain, fever or headache.    [provider]  albuterol (PROVENTIL) (2.5 MG/3ML) 0.083% nebulizer solution Take 2.5 mg by nebulization every 6 (six) hours as needed for wheezing or shortness of breath.    [provider]  folic acid (FOLVITE) 1 MG tablet Take  1 mg by mouth daily.    [provider]  levETIRAcetam (KEPPRA) 500 MG tablet Take 500 mg by mouth 3 (three) times daily.    [provider]  Multiple Vitamins-Minerals (MULTIVITAMIN WITH MINERALS) tablet Take 1 tablet by mouth daily.    [provider]  thiamine 100 MG tablet Take 100 mg by mouth daily.    [provider]    Family History No family history on file.  Social History Social History   Tobacco Use  . Smoking status: Never Smoker  . Smokeless tobacco: Never Used  Substance Use Topics  . Alcohol use: Yes  . Drug use: No     Allergies   Patient has no known allergies.   Review of Systems Review of Systems  Unable to perform ROS: Acuity of condition  Constitutional: Negative for chills and fever.  HENT: Negative for congestion and facial swelling.   Eyes: Negative for discharge and visual disturbance.  Respiratory: Positive for shortness of breath.   Cardiovascular: Negative for chest pain and palpitations.  Gastrointestinal: Negative for abdominal pain, diarrhea and vomiting.  Musculoskeletal: Negative for arthralgias and myalgias.  Skin: Negative for color change and rash.  Neurological: Negative for tremors, syncope and headaches.  Psychiatric/Behavioral: Negative for confusion and dysphoric mood.     Physical Exam Updated Vital Signs BP 102/76   Pulse Marland Kitchen)  105   Temp (!) 103.2 F (39.6 C) (Rectal)   Resp (!) 28   SpO2 95%   Physical Exam Vitals signs and nursing note reviewed.  Constitutional:      Appearance: He is well-developed.  HENT:     Head: Normocephalic and atraumatic.  Eyes:     Pupils: Pupils are equal, round, and reactive to light.  Neck:     Musculoskeletal: Normal range of motion and neck supple.     Vascular: No JVD.  Cardiovascular:     Rate and Rhythm: Regular rhythm. Tachycardia present.     Heart sounds: No murmur. No friction rub. No gallop.      Comments: Distant breath sounds in all  fields Pulmonary:     Effort: Respiratory distress present.     Breath sounds: No wheezing.  Abdominal:     General: There is no distension.     Tenderness: There is no guarding or rebound.  Musculoskeletal: Normal range of motion.  Skin:    Coloration: Skin is not pale.     Findings: No rash.  Neurological:     Mental Status: He is alert and oriented to person, place, and time.  Psychiatric:        Behavior: Behavior normal.      ED Treatments / Results  Labs (all labs ordered are listed, but only abnormal results are displayed) Labs Reviewed  SARS CORONAVIRUS 2 (HOSPITAL ORDER, PERFORMED IN [facility]) - Abnormal; Notable for the following components:      Result Value   SARS Coronavirus 2 POSITIVE (*)    All other components within normal limits  CBC WITH DIFFERENTIAL/PLATELET - Abnormal; Notable for the following components:   WBC 11.3 (*)    Neutro Abs 9.7 (*)    All other components within normal limits  COMPREHENSIVE METABOLIC PANEL - Abnormal; Notable for the following components:   Potassium 5.5 (*)    CO2 15 (*)    Glucose, Bld 174 (*)    Creatinine, Ser 1.36 (*)    Calcium 8.3 (*)    Albumin 3.2 (*)    AST 77 (*)    Total Bilirubin 2.4 (*)    GFR calc non Af Amer 53 (*)    Anion gap 19 (*)    All other components within normal limits  LACTIC ACID, PLASMA - Abnormal; Notable for the following components:   Lactic Acid, Venous 9.5 (*)    All other components within normal limits  POCT I-STAT 7, (LYTES, BLD GAS, ICA,H+H) - Abnormal; Notable for the following components:   pH, Arterial 7.333 (*)    pCO2 arterial 26.1 (*)    Bicarbonate 13.9 (*)    TCO2 15 (*)    Acid-base deficit 10.0 (*)    Calcium, Ion 1.12 (*)    All other components within normal limits  CULTURE, BLOOD (ROUTINE X 2)  CULTURE, BLOOD (ROUTINE X 2)  LACTIC ACID, PLASMA  BLOOD GAS, ARTERIAL    EKG EKG Interpretation  Date/Time:  Saturday Feb 21 2019 19:34:32 EDT  Ventricular Rate:  134 PR Interval:    QRS Duration: 73 QT Interval:  309 QTC Calculation: 462 R Axis:   99 Text Interpretation:  Sinus tachycardia Right axis deviation Borderline T wave abnormalities Minimal ST elevation, inferior leads Since last tracing rate faster Confirmed by Melene PlanFloyd, Alver Leete [phone]([id number]) on 03-Oct-2018 8:15:57 PM   Radiology Dg Chest Port 1 View  Result Date: 03-Oct-2018 CLINICAL DATA:  Shortness of  breath EXAM: PORTABLE CHEST 1 VIEW COMPARISON:  None. FINDINGS: Diffuse bilateral airspace disease noted. Heart is normal size. No effusions. No acute bony abnormality. IMPRESSION: Diffuse bilateral airspace disease, likely infection. Electronically Signed   By: Charlett Nose M.D.   On: 01/23/2019 22:36    Procedures Procedures (including critical care time)  Medications Ordered in ED Medications  sodium chloride 0.9 % bolus 1,000 mL (has no administration in time range)  vancomycin (VANCOCIN) 1,500 mg in sodium chloride 0.9 % 500 mL IVPB (has no administration in time range)  piperacillin-tazobactam (ZOSYN) IVPB 3.375 g (has no administration in time range)  acetaminophen (TYLENOL) suppository 650 mg (650 mg Rectal Given 02/17/2019 1955)  sodium chloride 0.9 % bolus 1,000 mL (1,000 mLs Intravenous New Bag/Given 02/03/2019 2008)     Initial Impression / Assessment and Plan / ED Course  I have reviewed the triage vital signs and the nursing notes.  Pertinent labs & imaging results that were available during my care of the patient were reviewed by me and considered in my medical decision making (see chart for details).        68 yo M with a chief complaint of shortness of breath.  This been worsening over the past week.  Patient is breathing with a respiratory rate in the mid to upper 30s.  He is febrile to 103.  Will give Tylenol give a bolus of IV fluids.  Check lab work chest x-ray.  Patient is a DNR.    Lactic 9.5.  Discussed with hospitalist for admission.   CRITICAL CARE  Performed by: Rae Roam   Total critical care time: 80 minutes  Critical care time was exclusive of separately billable procedures and treating other patients.  Critical care was necessary to treat or prevent imminent or life-threatening deterioration.  Critical care was time spent personally by me on the following activities: development of treatment plan with patient and/or surrogate as well as nursing, discussions with consultants, evaluation of patient's response to treatment, examination of patient, obtaining history from patient or surrogate, ordering and performing treatments and interventions, ordering and review of laboratory studies, ordering and review of radiographic studies, pulse oximetry and re-evaluation of patient's condition.  The patients results and plan were reviewed and discussed.   Any x-rays performed were independently reviewed by myself.   Differential diagnosis were considered with the presenting HPI.  Medications  sodium chloride 0.9 % bolus 1,000 mL (has no administration in time range)  vancomycin (VANCOCIN) 1,500 mg in sodium chloride 0.9 % 500 mL IVPB (has no administration in time range)  piperacillin-tazobactam (ZOSYN) IVPB 3.375 g (has no administration in time range)  acetaminophen (TYLENOL) suppository 650 mg (650 mg Rectal Given 02/03/2019 1955)  sodium chloride 0.9 % bolus 1,000 mL (1,000 mLs Intravenous New Bag/Given 01/27/2019 2008)    Vitals:   02/17/2019 2115 01/31/2019 2200 02/05/2019 2215 01/26/2019 2301  BP: (!) 105/94 (!) 81/63 96/70 102/76  Pulse: (!) 112 (!) 107 (!) 106 (!) 105  Resp: (!) 28 (!) 41 (!) 32 (!) 28  Temp:      TempSrc:      SpO2: 100% 97% 96% 95%    Final diagnoses:  COVID-19 virus infection  Acute respiratory failure with hypoxia (HCC)    Admission/ observation were discussed with the admitting physician, patient and/or family and they are comfortable with the plan.   Final Clinical Impressions(s) / ED Diagnoses    Final diagnoses:  COVID-19 virus infection  Acute respiratory  failure with hypoxia [facility])    ED Discharge Orders    None       Melene Plan, DO March 14, 2019 2302

## 2019-02-22 ENCOUNTER — Inpatient Hospital Stay (HOSPITAL_COMMUNITY): Payer: Medicare Other

## 2019-02-22 LAB — COMPREHENSIVE METABOLIC PANEL
ALT: 16 U/L (ref 0–44)
AST: 52 U/L — ABNORMAL HIGH (ref 15–41)
Albumin: 3 g/dL — ABNORMAL LOW (ref 3.5–5.0)
Alkaline Phosphatase: 66 U/L (ref 38–126)
Anion gap: 14 (ref 5–15)
BUN: 13 mg/dL (ref 8–23)
CO2: 19 mmol/L — ABNORMAL LOW (ref 22–32)
Calcium: 7.9 mg/dL — ABNORMAL LOW (ref 8.9–10.3)
Chloride: 111 mmol/L (ref 98–111)
Creatinine, Ser: 1.37 mg/dL — ABNORMAL HIGH (ref 0.61–1.24)
GFR calc Af Amer: >60 mL/min (ref >60)
GFR calc non Af Amer: 53 mL/min — ABNORMAL LOW (ref >60)
Glucose, Bld: 148 mg/dL — ABNORMAL HIGH (ref 70–99)
Potassium: 4.4 mmol/L (ref 3.5–5.1)
Sodium: 144 mmol/L (ref 135–145)
Total Bilirubin: 0.8 mg/dL (ref 0.3–1.2)
Total Protein: 7.9 g/dL (ref 6.5–8.1)

## 2019-02-22 LAB — CBC WITH DIFFERENTIAL/PLATELET
Abs Immature Granulocytes: 0.06 10*3/uL (ref 0.00–0.07)
Basophils Absolute: 0 10*3/uL (ref 0.0–0.1)
Basophils Relative: 0 %
Eosinophils Absolute: 0 10*3/uL (ref 0.0–0.5)
Eosinophils Relative: 0 %
HCT: 40 % (ref 39.0–52.0)
Hemoglobin: 12.4 g/dL — ABNORMAL LOW (ref 13.0–17.0)
Immature Granulocytes: 1 %
Lymphocytes Relative: 12 %
Lymphs Abs: 1.1 10*3/uL (ref 0.7–4.0)
MCH: 29.2 pg (ref 26.0–34.0)
MCHC: 31 g/dL (ref 30.0–36.0)
MCV: 94.3 fL (ref 80.0–100.0)
Monocytes Absolute: 0.6 10*3/uL (ref 0.1–1.0)
Monocytes Relative: 6 %
Neutro Abs: 7 10*3/uL (ref 1.7–7.7)
Neutrophils Relative %: 81 %
Platelets: 127 10*3/uL — ABNORMAL LOW (ref 150–400)
RBC: 4.24 MIL/uL (ref 4.22–5.81)
RDW: 14.1 % (ref 11.5–15.5)
WBC: 8.8 10*3/uL (ref 4.0–10.5)
nRBC: 0 % (ref 0.0–0.2)

## 2019-02-22 LAB — CBG MONITORING, ED: Glucose-Capillary: 138 mg/dL — ABNORMAL HIGH (ref 70–99)

## 2019-02-22 LAB — D-DIMER, QUANTITATIVE
D-Dimer, Quant: 4.97 ug/mL-FEU — ABNORMAL HIGH (ref 0.00–0.50)
D-Dimer, Quant: 8.24 ug/mL-FEU — ABNORMAL HIGH (ref 0.00–0.50)

## 2019-02-22 LAB — C-REACTIVE PROTEIN
CRP: 14.2 mg/dL — ABNORMAL HIGH (ref <1.0)
CRP: 16.4 mg/dL — ABNORMAL HIGH (ref <1.0)

## 2019-02-22 LAB — FERRITIN
Ferritin: 958 ng/mL — ABNORMAL HIGH (ref 24–336)
Ferritin: 1040 ng/mL — ABNORMAL HIGH (ref 24–336)

## 2019-02-22 LAB — ABO/RH: ABO/RH(D): B POS

## 2019-02-22 LAB — MAGNESIUM: Magnesium: 1.9 mg/dL (ref 1.7–2.4)

## 2019-02-22 LAB — LACTIC ACID, PLASMA: Lactic Acid, Venous: 1.6 mmol/L (ref 0.5–1.9)

## 2019-02-22 LAB — PHOSPHORUS: Phosphorus: 3.5 mg/dL (ref 2.5–4.6)

## 2019-02-22 LAB — LACTATE DEHYDROGENASE: LDH: 513 U/L — ABNORMAL HIGH (ref 98–192)

## 2019-02-22 LAB — CK: Total CK: 361 U/L (ref 49–397)

## 2019-02-22 LAB — BRAIN NATRIURETIC PEPTIDE
B Natriuretic Peptide: 124.5 pg/mL — ABNORMAL HIGH (ref 0.0–100.0)
B Natriuretic Peptide: 185 pg/mL — ABNORMAL HIGH (ref 0.0–100.0)

## 2019-02-22 LAB — HIV ANTIBODY (ROUTINE TESTING W REFLEX): HIV Screen 4th Generation wRfx: NONREACTIVE

## 2019-02-22 LAB — PROCALCITONIN: Procalcitonin: 11.57 ng/mL

## 2019-02-22 MED ORDER — HYDRALAZINE HCL 20 MG/ML IJ SOLN: 10.0000 mg | Freq: Four times a day (QID) | INTRAMUSCULAR | Status: DC | PRN

## 2019-02-22 MED ORDER — LORAZEPAM 2 MG/ML IJ SOLN: 1.0000 mg | INTRAMUSCULAR | Status: DC | PRN

## 2019-02-22 MED ORDER — SODIUM CHLORIDE 0.9 % IV SOLN
INTRAVENOUS | Status: DC | PRN
  Administered 2019-02-22 – 2019-02-23 (×2): 250 mL via INTRAVENOUS

## 2019-02-22 MED ORDER — LORAZEPAM 2 MG/ML PO CONC: 1.0000 mg | ORAL | Status: DC | PRN

## 2019-02-22 MED ORDER — MORPHINE SULFATE (PF) 2 MG/ML IV SOLN
2.0000 mg | Freq: Once | INTRAVENOUS | Status: AC
  Administered 2019-02-22: 2 mg via INTRAVENOUS
  Filled 2019-02-22: qty 1

## 2019-02-22 MED ORDER — ATROPINE SULFATE 1 % OP SOLN
4.0000 [drp] | OPHTHALMIC | Status: DC | PRN
  Administered 2019-02-22 – 2019-02-23 (×2): 4 [drp] via SUBLINGUAL
  Filled 2019-02-22: qty 2

## 2019-02-22 MED ORDER — ACETAMINOPHEN 325 MG PO TABS: 650.0000 mg | ORAL_TABLET | Freq: Four times a day (QID) | ORAL | Status: DC | PRN

## 2019-02-22 MED ORDER — MORPHINE 100MG IN NS 100ML (1MG/ML) PREMIX INFUSION
5.0000 mg/h | INTRAVENOUS | Status: DC
  Administered 2019-02-22: 5 mg/h via INTRAVENOUS
  Filled 2019-02-22: qty 100

## 2019-02-22 MED ORDER — GLYCOPYRROLATE 1 MG PO TABS: 1.0000 mg | ORAL_TABLET | ORAL | Status: DC | PRN

## 2019-02-22 MED ORDER — FUROSEMIDE 10 MG/ML IJ SOLN
60.0000 mg | Freq: Once | INTRAMUSCULAR | Status: AC
  Administered 2019-02-22: 60 mg via INTRAVENOUS
  Filled 2019-02-22: qty 6

## 2019-02-22 MED ORDER — BIOTENE DRY MOUTH MT LIQD: 15.0000 mL | OROMUCOSAL | Status: DC | PRN

## 2019-02-22 MED ORDER — HEPARIN SODIUM (PORCINE) 5000 UNIT/ML IJ SOLN
7500.0000 [IU] | Freq: Three times a day (TID) | INTRAMUSCULAR | Status: DC
  Administered 2019-02-22 – 2019-02-25 (×4): 7500 [IU] via SUBCUTANEOUS
  Filled 2019-02-22 (×5): qty 2

## 2019-02-22 MED ORDER — LORAZEPAM 0.5 MG PO TABS: 1.0000 mg | ORAL_TABLET | ORAL | Status: DC | PRN

## 2019-02-22 MED ORDER — MORPHINE 100MG IN NS 100ML (1MG/ML) PREMIX INFUSION
0.0000 mg/h | INTRAVENOUS | Status: DC
  Administered 2019-02-22 – 2019-02-23 (×2): 7 mg/h via INTRAVENOUS
  Administered 2019-02-23 – 2019-02-25 (×4): 8 mg/h via INTRAVENOUS
  Filled 2019-02-22 (×6): qty 100

## 2019-02-22 MED ORDER — ONDANSETRON 4 MG PO TBDP: 4.0000 mg | ORAL_TABLET | Freq: Four times a day (QID) | ORAL | Status: DC | PRN

## 2019-02-22 MED ORDER — HALOPERIDOL 0.5 MG PO TABS
0.5000 mg | ORAL_TABLET | ORAL | Status: DC | PRN
  Filled 2019-02-22: qty 1

## 2019-02-22 MED ORDER — HALOPERIDOL LACTATE 5 MG/ML IJ SOLN
0.5000 mg | INTRAMUSCULAR | Status: DC | PRN
  Administered 2019-02-22: 0.5 mg via INTRAVENOUS
  Filled 2019-02-22: qty 1

## 2019-02-22 MED ORDER — ACETAMINOPHEN 650 MG RE SUPP: 650.0000 mg | Freq: Four times a day (QID) | RECTAL | Status: DC | PRN

## 2019-02-22 MED ORDER — ONDANSETRON HCL 4 MG/2ML IJ SOLN: 4.0000 mg | Freq: Four times a day (QID) | INTRAMUSCULAR | Status: DC | PRN

## 2019-02-22 MED ORDER — MORPHINE BOLUS VIA INFUSION
2.0000 mg | INTRAVENOUS | Status: DC | PRN
  Administered 2019-02-23: 2 mg via INTRAVENOUS
  Filled 2019-02-22: qty 2

## 2019-02-22 MED ORDER — MORPHINE SULFATE (PF) 2 MG/ML IV SOLN
0.5000 mg | Freq: Once | INTRAVENOUS | Status: AC
  Administered 2019-02-22: 0.5 mg via INTRAVENOUS
  Filled 2019-02-22: qty 1

## 2019-02-22 MED ORDER — ALBUTEROL SULFATE HFA 108 (90 BASE) MCG/ACT IN AERS
2.0000 | INHALATION_SPRAY | Freq: Four times a day (QID) | RESPIRATORY_TRACT | Status: DC
  Administered 2019-02-22 – 2019-02-23 (×4): 2 via RESPIRATORY_TRACT
  Filled 2019-02-22: qty 6.7

## 2019-02-22 MED ORDER — LIP MEDEX EX OINT
TOPICAL_OINTMENT | CUTANEOUS | Status: DC | PRN
  Filled 2019-02-22: qty 7

## 2019-02-22 MED ORDER — POLYVINYL ALCOHOL 1.4 % OP SOLN: 1.0000 [drp] | Freq: Four times a day (QID) | OPHTHALMIC | Status: DC | PRN

## 2019-02-22 MED ORDER — MORPHINE BOLUS VIA INFUSION
2.0000 mg | Freq: Once | INTRAVENOUS | Status: AC
  Administered 2019-02-22: 2 mg via INTRAVENOUS

## 2019-02-22 MED ORDER — HALOPERIDOL LACTATE 2 MG/ML PO CONC
0.5000 mg | ORAL | Status: DC | PRN
  Filled 2019-02-22: qty 0.3

## 2019-02-22 MED ORDER — BISACODYL 10 MG RE SUPP: 10.0000 mg | Freq: Every day | RECTAL | Status: DC | PRN

## 2019-02-22 MED ORDER — GLYCOPYRROLATE 0.2 MG/ML IJ SOLN: 0.2000 mg | INTRAMUSCULAR | Status: DC | PRN

## 2019-02-22 MED ORDER — DILTIAZEM HCL 25 MG/5ML IV SOLN
10.0000 mg | Freq: Four times a day (QID) | INTRAVENOUS | Status: DC | PRN
  Filled 2019-02-22: qty 5

## 2019-02-22 MED ORDER — LEVETIRACETAM IN NACL 500 MG/100ML IV SOLN
500.0000 mg | Freq: Three times a day (TID) | INTRAVENOUS | Status: DC
  Administered 2019-02-22 – 2019-02-25 (×9): 500 mg via INTRAVENOUS
  Filled 2019-02-22 (×10): qty 100

## 2019-02-22 MED ORDER — THIAMINE HCL 100 MG/ML IJ SOLN
100.0000 mg | Freq: Every day | INTRAMUSCULAR | Status: DC
  Administered 2019-02-22 – 2019-02-24 (×3): 100 mg via INTRAVENOUS
  Filled 2019-02-22 (×3): qty 2

## 2019-02-22 NOTE — Progress Notes (Signed)
Late entry At the beginning of the shift the patient was in respiratory distress on 15 L NRB. MD paged, orders received. Despite albuterol,  60 mg of lasix and one time dose of morphine patients respiration rate remained in the 40s and morphine gtt was started. After morphine was started, [facility] approved for his daughter, who is also his power of attorney, to visit. Daughter verbalize she is appreciative of [place] keeping him comfortable and accepts this plan of care  Throughout shift patient continued to have loud labored breathing and respirations in the 20's to 30s. MD paged for increase in titration of morphine gtt, no new orders received at that time. IV solumedrol given, no change in patient condition. Oncoming night shift MD paged regarding comfort care measures. Received verbal orders for morphine bolus. MD to put in additional orders.

## 2019-02-22 NOTE — Progress Notes (Signed)
RT responded to Rapid Response on Pt for respiratory distress. Pt on NRB, Increased WOB, RR > 40, pt with severe distress. Pt is DNR?DNI with gold form at bedside. RN gave morphine push and MD called to initiate Comfort Care Protocol. RT will continue to monitor

## 2019-02-22 NOTE — Progress Notes (Signed)
Called and updated patient's daughter who is healthcare power of attorney; answered all questions. She needs to know who to send the healthcare power of attorney paperwork to.

## 2019-02-22 NOTE — Progress Notes (Signed)
Thomas Mcgrath DYN:[id number] DOB: Dec 14, 1950 DOA: 02/20/2019 PCP: Renford Dills, MD   Subj: Paged to bedside secondary to increased respiratory distress.  Patient tachypneic, gasping for air, using accessory muscles, RR > 40.    Obj: Objective: VITAL SIGNS: Pulse Rate: 110 (05/31 1900) SPO2; FIO2:   Intake/Output Summary (Last 24 hours) at 02/22/2019 1935 Last data filed at 02/22/2019 1716 Gross per 24 hour  Intake 100 ml  Output 900 ml  Net -800 ml     Exam: General: Nonresponsive to painful stimuli, positive acute respiratory distress Lungs: Clear to auscultation bilaterally, but decreased.  Positive upper airway rhonchi.  Negative wheezes or crackles Cardiovascular: Tachycardic, without murmur gallop or rub normal S1 and S2 Abdomen: Nontender, nondistended, soft, bowel sounds positive, no rebound, no ascites, no appreciable mass Extremities: No significant cyanosis, clubbing, or edema bilateral lower extremities      Procedure/Significant Events:   I have personally reviewed and interpreted all radiology studies and my findings are as above.   Culture   Antibiotics: Anti-infectives (From admission, onward)   Start     Ordered Stop   02/05/2019 2215  vancomycin (VANCOCIN) 1,500 mg in sodium chloride 0.9 % 500 mL IVPB     02/06/2019 2214 02/22/19 0340   01/28/2019 2215  piperacillin-tazobactam (ZOSYN) IVPB 3.375 g     02/14/2019 2214 01/31/2019 2350        Principal Problem:   Acute respiratory disease due to COVID-19 virus Active Problems:   Seizure disorder (HCC)   A/P COVID 19 pneumonia - Care per primary team  Acute respiratory distress with hypoxia. -Paged to bedside secondary to patient with increasing respiratory distress while on 15 L nonrebreather and morphine drip at 5 mg/hour. - Increase patient's morphine drip to 7 mg/hour, with 2 mg every 4 as needed uncontrolled pain or RR> 25 -Have started patient on end-of-life order set for comfort - RN William Hamburger spoke with patient's daughter who stated goals of care were comfort. - We will continue with plan of care per primary team but not advance level of care further.            Care during the described time interval was provided by me .  I have reviewed this patient's available data, including medical history, events of note, physical examination, and all test results as part of my evaluation.

## 2019-02-22 NOTE — ED Notes (Signed)
Pt with liquid BM, incontinence pad and pericare completed

## 2019-02-22 NOTE — Progress Notes (Signed)
PROGRESS NOTE                                                                                                                                                                                                             Patient Demographics:    Thomas Mcgrath, is a 68 y.o. male, DOB - 07/15/1951, ZOX:[id number]  Outpatient Primary MD for the patient is Renford Dills, MD    LOS - 1  Admit date - 02/20/2019    Chief Complaint  Patient presents with   Respiratory Distress       Brief Narrative Thomas Mcgrath is a 68 y.o. male with medical history significant for advanced dementia, seizure disorder, alcohol use who was brought to the ED via EMS from [facility] nursing facility for respiratory distress.  Patient is unable to provide any history due to respiratory distress therefore entirety of history is obtained from EDP, chart review, and daughter by phone.  Patient was apparently diagnosed with positive COVID-19 test at his facility and isolated from other residents.  Over the last 24 hours he had progressive shortness of breath, respiratory distress, and fever.  EMS were called today and he was found to have a oxygen saturation in the 40s which was improved with CPAP.  He is brought to the ED on nonrebreather with O2 sats in the mid to upper 80s.   Subjective:    Thomas Mcgrath today in bed, has baseline dementia hence unable to answer questions or follow commands but appears to be in immense distress due to severe shortness of breath despite wearing nonrebreather mask.   Assessment  & Plan :     1. Acute Hypoxic Resp. Failure due to Acute Covid 19 Viral Illness during the ongoing 2020 Covid 19 Pandemic -  In a patient with severe underlying dementia, appears to be in severe respiratory distress despite wearing nonrebreather mask, he is DNR, reviewed notes from last night and also discussed his case with patient's daughter in detail on  02/22/2019.  She confirms that he is DNR and agrees that patient should be transition to comfort measures.  For now we will continue steroids, will also give a trial of IV Lasix as BNP was slightly elevated, however will at this time start morphine drip as he is in terrible distress and is literally gasping for air.  If there is clinical improvement after steroids and Lasix we can always reconsider weaning down morphine drip and resuming aggressive medical care.  But I doubt patient will survive this admission and I think comfort measures is the most appropriate line of care for him as was thought by the admitting physician as well.     ABG     Component Value Date/Time   PHART 7.333 (L) 01/29/2019 2000   PCO2ART 26.1 (L) 02/16/2019 2000   PO2ART 89.0 01/24/2019 2000   HCO3 13.9 (L) 02/04/2019 2000   TCO2 15 (L) 02/20/2019 2000   ACIDBASEDEF 10.0 (H) 02/12/2019 2000   O2SAT 96.0 02/22/2019 2000    COVID-19 Labs  Recent Labs    02/04/2019 2341 02/22/19 0345  DDIMER 4.97* 8.24*  FERRITIN 958* 1,040*  LDH 513*  --   CRP 14.2* 16.4*    Lab Results  Component Value Date   SARSCOV2NAA POSITIVE (A) 01/26/2019        Component Value Date/Time   BNP 185.0 (H) 02/22/2019 0345      2.  Severe dementia.  At risk for delirium.  PRN Haldol.  3.  Mildly elevated BNP with rails.  CHF nonspecific.  Not a candidate for urgent echocardiogram, trial of Lasix and monitor.  Currently n.p.o.  No old echo on file.  4.  Seizures.  Placed on Keppra IV.      Condition - Extremely Guarded  Family Communication  : daughter 02/22/19 - DNR & comfort care  Code Status :  DNR  Diet : NPO  Disposition Plan  : TBD  Consults  :  None  Procedures  :     PUD Prophylaxis :   DVT Prophylaxis  :   Heparin   Lab Results  Component Value Date   PLT 127 (L) 02/22/2019    Inpatient Medications  Scheduled Meds:  albuterol  2 puff Inhalation Q6H   folic acid  1 mg Oral Daily   heparin   7,500 Units Subcutaneous Q8H   methylPREDNISolone (SOLU-MEDROL) injection  40 mg Intravenous Q8H    morphine injection  2 mg Intravenous Once   sodium chloride flush  3 mL Intravenous Q12H   thiamine injection  100 mg Intravenous Daily   Continuous Infusions:  levETIRAcetam     morphine     PRN Meds:.acetaminophen, atropine, bisacodyl, chlorpheniramine-HYDROcodone, guaiFENesin-dextromethorphan, haloperidol **OR** haloperidol **OR** haloperidol lactate, LORazepam **OR** LORazepam **OR** LORazepam  Antibiotics  :    Anti-infectives (From admission, onward)   Start     Dose/Rate Route Frequency Ordered Stop   02/04/2019 2215  vancomycin (VANCOCIN) 1,500 mg in sodium chloride 0.9 % 500 mL IVPB     1,500 mg 250 mL/hr over 120 Minutes Intravenous  Once 01/24/2019 2214 02/22/19 0340   02/04/2019 2215  piperacillin-tazobactam (ZOSYN) IVPB 3.375 g     3.375 g 100 mL/hr over 30 Minutes Intravenous  Once 01/24/2019 2214 01/26/2019 2350       Time Spent in minutes  30   Susa Raring M.D on 02/22/2019 at 9:24 AM  To page go to [email or web address] - password [facility]  [facility] -  Office  [phone]     See all Orders from today for further details    Objective:   Vitals:   02/22/19 0245 02/22/19 0357 02/22/19 0550 02/22/19 0600  BP: 115/62  (!) 156/93 (!) 154/91  Pulse: 71  (!) 115 (!) 112  Resp: (!) 23  (!) 39 (!) 33  Temp:  99.1  F (37.3 C) 99.4 F (37.4 C)   TempSrc:  Temporal Axillary   SpO2: (!) 88%  (!) 88% 93%    Wt Readings from Last 3 Encounters:  No data found for Wt    No intake or output data in the 24 hours ending 02/22/19 0981   Physical Exam  Awake baseline confused due to dementia, appears to be in immense distress due to shortness of breath [place].AT,PERRAL Supple Neck,No JVD, No cervical lymphadenopathy appriciated.  Symmetrical Chest wall movement, Good air movement bilaterally, few rales RRR,No Gallops,Rubs or new Murmurs, No Parasternal Heave +ve  B.Sounds, Abd Soft, No tenderness, No organomegaly appriciated, No rebound - guarding or rigidity. No Cyanosis, Clubbing or edema, No new Rash or bruise      Data Review:    CBC Recent Labs  Lab 02/02/2019 1936 02/07/2019 2000 02/22/19 0345  WBC 11.3*  --  8.8  HGB 13.7 13.6 12.4*  HCT 44.0 40.0 40.0  PLT 169  --  127*  MCV 96.1  --  94.3  MCH 29.9  --  29.2  MCHC 31.1  --  31.0  RDW 14.1  --  14.1  LYMPHSABS 1.3  --  1.1  MONOABS 0.3  --  0.6  EOSABS 0.1  --  0.0  BASOSABS 0.0  --  0.0    Chemistries  Recent Labs  Lab 02/16/2019 1936 02/01/2019 2000 02/22/19 0345  NA 140 143 144  K 5.5* 3.6 4.4  CL 106  --  111  CO2 15*  --  19*  GLUCOSE 174*  --  148*  BUN 10  --  13  CREATININE 1.36*  --  1.37*  CALCIUM 8.3*  --  7.9*  MG  --   --  1.9  AST 77*  --  52*  ALT 22  --  16  ALKPHOS 72  --  66  BILITOT 2.4*  --  0.8   ------------------------------------------------------------------------------------------------------------------ No results for input(s): CHOL, HDL, LDLCALC, TRIG, CHOLHDL, LDLDIRECT in the last 72 hours.  No results found for: HGBA1C ------------------------------------------------------------------------------------------------------------------ No results for input(s): TSH, T4TOTAL, T3FREE, THYROIDAB in the last 72 hours.  Invalid input(s): FREET3  Cardiac Enzymes No results for input(s): CKMB, TROPONINI, MYOGLOBIN in the last 168 hours.  Invalid input(s): CK ------------------------------------------------------------------------------------------------------------------    Component Value Date/Time   BNP 185.0 (H) 02/22/2019 0345    Micro Results Recent Results (from the past 240 hour(s))  SARS Coronavirus 2 (CEPHEID- Performed in [facility] hospital lab), Hosp Order     Status: Abnormal   Collection Time: 02/22/2019  8:04 PM  Result Value Ref Range Status   SARS Coronavirus 2 POSITIVE (A) NEGATIVE Final    Comment: RESULT CALLED TO,  READ BACK BY AND VERIFIED WITH: RN L CHILTON @ 2155 01/27/2019 BY S GEZAHEGN (NOTE) If result is NEGATIVE SARS-CoV-2 target nucleic acids are NOT DETECTED. The SARS-CoV-2 RNA is generally detectable in upper and lower  respiratory specimens during the acute phase of infection. The lowest  concentration of SARS-CoV-2 viral copies this assay can detect is 250  copies / mL. A negative result does not preclude SARS-CoV-2 infection  and should not be used as the sole basis for treatment or other  patient management decisions.  A negative result may occur with  improper specimen collection / handling, submission of specimen other  than nasopharyngeal swab, presence of viral mutation(s) within the  areas targeted by this assay, and inadequate number of viral copies  (<250  copies / mL). A negative result must be combined with clinical  observations, patient history, and epidemiological information. If result is POSITIVE SARS-CoV-2 target nucleic acids are DETECT ED. The SARS-CoV-2 RNA is generally detectable in upper and lower  respiratory specimens during the acute phase of infection.  Positive  results are indicative of active infection with SARS-CoV-2.  Clinical  correlation with patient history and other diagnostic information is  necessary to determine patient infection status.  Positive results do  not rule out bacterial infection or co-infection with other viruses. If result is PRESUMPTIVE POSTIVE SARS-CoV-2 nucleic acids MAY BE PRESENT.   A presumptive positive result was obtained on the submitted specimen  and confirmed on repeat testing.  While 2019 novel coronavirus  (SARS-CoV-2) nucleic acids may be present in the submitted sample  additional confirmatory testing may be necessary for epidemiological  and / or clinical management purposes  to differentiate between  SARS-CoV-2 and other Sarbecovirus currently known to infect humans.  If clinically indicated additional testing with an  alternate test  methodology (LAB74 53) is advised. The SARS-CoV-2 RNA is generally  detectable in upper and lower respiratory specimens during the acute  phase of infection. The expected result is Negative. Fact Sheet for Patients:  BoilerBrush.com.cyhttps://www.fda.gov/media/136312/download Fact Sheet for Healthcare Providers: https://pope.com/https://www.fda.gov/media/136313/download This test is not yet approved or cleared by the [place] FDA and has been authorized for detection and/or diagnosis of SARS-CoV-2 by FDA under an Emergency Use Authorization (EUA).  This EUA will remain in effect (meaning this test can be used) for the duration of the COVID-19 declaration under Section 564(b)(1) of the Act, 21 U.S.C. section 360bbb-3(b)(1), unless the authorization is terminated or revoked sooner. Performed at [facility], [address] [place], [place] [address]     Radiology Reports Dg Chest Rossie[place] 1 View  Result Date: 02/22/2019 CLINICAL DATA:  Shortness of breath, COVID-19 EXAM: PORTABLE CHEST 1 VIEW COMPARISON:  Jun 19, 2019 FINDINGS: Multifocal patchy airspace opacities, improved. No definite pleural effusions. No pneumothorax. The heart is normal in size. IMPRESSION: Multifocal patchy airspace opacities, compatible with pneumonia, improved. Electronically Signed   By: Charline BillsSriyesh  Krishnan M.D.   On: 02/22/2019 08:42   Dg Chest Port 1 View  Result Date: 07-07-19 CLINICAL DATA:  Shortness of breath EXAM: PORTABLE CHEST 1 VIEW COMPARISON:  None. FINDINGS: Diffuse bilateral airspace disease noted. Heart is normal size. No effusions. No acute bony abnormality. IMPRESSION: Diffuse bilateral airspace disease, likely infection. Electronically Signed   By: Charlett NoseKevin  Dover M.D.   On: Jun 19, 2019 22:36

## 2019-02-22 NOTE — Progress Notes (Signed)
Spoke with daughter, family updated and allowed to facetime with patient.

## 2019-02-23 LAB — PHOSPHORUS: Phosphorus: 4.4 mg/dL (ref 2.5–4.6)

## 2019-02-23 LAB — MAGNESIUM: Magnesium: 2.2 mg/dL (ref 1.7–2.4)

## 2019-02-23 LAB — CK: Total CK: 327 U/L (ref 49–397)

## 2019-02-23 LAB — CBC WITH DIFFERENTIAL/PLATELET
Abs Immature Granulocytes: 0.15 10*3/uL — ABNORMAL HIGH (ref 0.00–0.07)
Basophils Absolute: 0 10*3/uL (ref 0.0–0.1)
Basophils Relative: 0 %
Eosinophils Absolute: 0 10*3/uL (ref 0.0–0.5)
Eosinophils Relative: 0 %
HCT: 39.7 % (ref 39.0–52.0)
Hemoglobin: 12.3 g/dL — ABNORMAL LOW (ref 13.0–17.0)
Immature Granulocytes: 1 %
Lymphocytes Relative: 8 %
Lymphs Abs: 1.3 10*3/uL (ref 0.7–4.0)
MCH: 29.8 pg (ref 26.0–34.0)
MCHC: 31 g/dL (ref 30.0–36.0)
MCV: 96.1 fL (ref 80.0–100.0)
Monocytes Absolute: 0.9 10*3/uL (ref 0.1–1.0)
Monocytes Relative: 5 %
Neutro Abs: 14.3 10*3/uL — ABNORMAL HIGH (ref 1.7–7.7)
Neutrophils Relative %: 86 %
Platelets: 202 10*3/uL (ref 150–400)
RBC: 4.13 MIL/uL — ABNORMAL LOW (ref 4.22–5.81)
RDW: 14.6 % (ref 11.5–15.5)
WBC: 16.7 10*3/uL — ABNORMAL HIGH (ref 4.0–10.5)
nRBC: 0 % (ref 0.0–0.2)

## 2019-02-23 LAB — COMPREHENSIVE METABOLIC PANEL
ALT: 19 U/L (ref 0–44)
AST: 47 U/L — ABNORMAL HIGH (ref 15–41)
Albumin: 3.3 g/dL — ABNORMAL LOW (ref 3.5–5.0)
Alkaline Phosphatase: 100 U/L (ref 38–126)
Anion gap: 9 (ref 5–15)
BUN: 22 mg/dL (ref 8–23)
CO2: 25 mmol/L (ref 22–32)
Calcium: 8.3 mg/dL — ABNORMAL LOW (ref 8.9–10.3)
Chloride: 109 mmol/L (ref 98–111)
Creatinine, Ser: 1.5 mg/dL — ABNORMAL HIGH (ref 0.61–1.24)
GFR calc Af Amer: 55 mL/min — ABNORMAL LOW (ref >60)
GFR calc non Af Amer: 47 mL/min — ABNORMAL LOW (ref >60)
Glucose, Bld: 190 mg/dL — ABNORMAL HIGH (ref 70–99)
Potassium: 4.9 mmol/L (ref 3.5–5.1)
Sodium: 143 mmol/L (ref 135–145)
Total Bilirubin: 0.5 mg/dL (ref 0.3–1.2)
Total Protein: 8.8 g/dL — ABNORMAL HIGH (ref 6.5–8.1)

## 2019-02-23 LAB — FERRITIN: Ferritin: 857 ng/mL — ABNORMAL HIGH (ref 24–336)

## 2019-02-23 LAB — D-DIMER, QUANTITATIVE: D-Dimer, Quant: 9.61 ug/mL-FEU — ABNORMAL HIGH (ref 0.00–0.50)

## 2019-02-23 LAB — C-REACTIVE PROTEIN: CRP: 28.4 mg/dL — ABNORMAL HIGH (ref <1.0)

## 2019-02-23 NOTE — Progress Notes (Signed)
PROGRESS NOTE                                                                                                                                                                                                             Patient Demographics:    Thomas Mcgrath, is a 68 y.o. male, DOB - 1951-04-03, ZOX:[id number]  Outpatient Primary MD for the patient is Renford Dills, MD    LOS - 2  Admit date - 02/18/2019    Chief Complaint  Patient presents with  . Respiratory Distress       Brief Narrative Thomas Mcgrath is a 68 y.o. male with medical history significant for advanced dementia, seizure disorder, alcohol use who was brought to the ED via EMS from [facility] nursing facility for respiratory distress.  Patient is unable to provide any history due to respiratory distress therefore entirety of history is obtained from EDP, chart review, and daughter by phone.  Patient was apparently diagnosed with positive COVID-19 test at his facility and isolated from other residents.  Over the last 24 hours he had progressive shortness of breath, respiratory distress, and fever.  EMS were called today and he was found to have a oxygen saturation in the 40s which was improved with CPAP.  He is brought to the ED on nonrebreather with O2 sats in the mid to upper 80s.   Subjective:    Thomas Mcgrath today in bed, has baseline dementia hence unable to answer questions or follow commands but appears to be in immense distress due to severe shortness of breath despite wearing nonrebreather mask.   Assessment  & Plan :     1. Acute Hypoxic Resp. Failure due to Acute Covid 19 Viral Illness during the ongoing 2020 Covid 19 Pandemic -  In a patient with severe underlying dementia, appears to be in severe respiratory distress despite wearing nonrebreather mask, he is DNR, reviewed notes from last night and also discussed his case with patient's daughter in detail on  02/22/2019.  She confirms that he is DNR and agrees that patient should be transition to comfort measures.  To new full comfort measures with IV morphine drip and supplemental oxygen as needed to keep him comfortable.  Goal of care remains comfort.    ABG     Component Value Date/Time   PHART 7.333 (L)  03/11/19 2000   PCO2ART 26.1 (L) March 11, 2019 2000   PO2ART 89.0 03/11/19 2000   HCO3 13.9 (L) 03-11-19 2000   TCO2 15 (L) 2019-03-11 2000   ACIDBASEDEF 10.0 (H) 03-11-2019 2000   O2SAT 96.0 03-11-19 2000    COVID-19 Labs  Recent Labs    2019/03/11 2341 02/22/19 0345 02/23/19 0330  DDIMER 4.97* 8.24* 9.61*  FERRITIN 958* 1,040* 857*  LDH 513*  --   --   CRP 14.2* 16.4* 28.4*    Lab Results  Component Value Date   SARSCOV2NAA POSITIVE (A) 03/11/19        Component Value Date/Time   BNP 185.0 (H) 02/22/2019 0345      2.  Severe dementia.  At risk for delirium.  PRN Haldol.  3.  Mildly elevated BNP with rails.  CHF nonspecific.  Not a candidate for urgent echocardiogram, trial of Lasix and monitor.  Currently n.p.o.  No old echo on file.  4.  Seizures.  Placed on Keppra IV.      Condition - Extremely Guarded  Family Communication  : daughter 02/22/19 - DNR & comfort care  Code Status :  DNR  Diet : NPO  Disposition Plan  : TBD  Consults  :  None  Procedures  :     PUD Prophylaxis :   DVT Prophylaxis  :   Heparin   Lab Results  Component Value Date   PLT 202 02/23/2019    Inpatient Medications  Scheduled Meds: . albuterol  2 puff Inhalation Q6H  . heparin  7,500 Units Subcutaneous Q8H  . sodium chloride flush  3 mL Intravenous Q12H  . thiamine injection  100 mg Intravenous Daily   Continuous Infusions: . sodium chloride 10 mL/hr at 02/23/19 1000  . levETIRAcetam Stopped (02/23/19 1497)  . morphine 8 mg/hr (02/23/19 1058)   PRN Meds:.sodium chloride, acetaminophen **OR** acetaminophen, antiseptic oral rinse, atropine, bisacodyl,  chlorpheniramine-HYDROcodone, diltiazem, guaiFENesin-dextromethorphan, haloperidol **OR** haloperidol **OR** haloperidol lactate, hydrALAZINE, lip balm, LORazepam **OR** LORazepam **OR** LORazepam, morphine, ondansetron **OR** ondansetron (ZOFRAN) IV, polyvinyl alcohol  Antibiotics  :    Anti-infectives (From admission, onward)   Start     Dose/Rate Route Frequency Ordered Stop   03-11-2019 2215  vancomycin (VANCOCIN) 1,500 mg in sodium chloride 0.9 % 500 mL IVPB     1,500 mg 250 mL/hr over 120 Minutes Intravenous  Once 03-11-19 2214 02/22/19 0340   Mar 11, 2019 2215  piperacillin-tazobactam (ZOSYN) IVPB 3.375 g     3.375 g 100 mL/hr over 30 Minutes Intravenous  Once 03/11/2019 2214 Mar 11, 2019 2350       Time Spent in minutes  30   Susa Raring M.D on 02/23/2019 at 11:47 AM  To page go to [email or web address] - password [facility]  [facility] -  Office  [phone]     See all Orders from today for further details    Objective:   Vitals:   02/23/19 0700 02/23/19 0745 02/23/19 0800 02/23/19 1000  BP:      Pulse: (!) 104 (!) 108 (!) 104 (!) 104  Resp: 13 18 14 15   Temp:      TempSrc:      SpO2: (!) 79% (!) 77% (!) 81% (!) 88%    Wt Readings from Last 3 Encounters:  No data found for Wt     Intake/Output Summary (Last 24 hours) at 02/23/2019 1147 Last data filed at 02/23/2019 1000 Gross per 24 hour  Intake 663.03 ml  Output 1250 ml  Net -  586.97 ml     Physical Exam  Patient in bed, much more calmer and comfortable on NRB mask and morphine drip. Minimal bibasilar Rales good air movement bilaterally Regular rate rhythm S1-S2 Abdomen soft No edema    Data Review:    CBC Recent Labs  Lab March 05, 2019 1936 03/05/2019 2000 02/22/19 0345 02/23/19 0330  WBC 11.3*  --  8.8 16.7*  HGB 13.7 13.6 12.4* 12.3*  HCT 44.0 40.0 40.0 39.7  PLT 169  --  127* 202  MCV 96.1  --  94.3 96.1  MCH 29.9  --  29.2 29.8  MCHC 31.1  --  31.0 31.0  RDW 14.1  --  14.1 14.6  LYMPHSABS 1.3  --   1.1 1.3  MONOABS 0.3  --  0.6 0.9  EOSABS 0.1  --  0.0 0.0  BASOSABS 0.0  --  0.0 0.0    Chemistries  Recent Labs  Lab 03/05/19 1936 05-Mar-2019 2000 02/22/19 0345 02/23/19 0330  NA 140 143 144 143  K 5.5* 3.6 4.4 4.9  CL 106  --  111 109  CO2 15*  --  19* 25  GLUCOSE 174*  --  148* 190*  BUN 10  --  13 22  CREATININE 1.36*  --  1.37* 1.50*  CALCIUM 8.3*  --  7.9* 8.3*  MG  --   --  1.9 2.2  AST 77*  --  52* 47*  ALT 22  --  16 19  ALKPHOS 72  --  66 100  BILITOT 2.4*  --  0.8 0.5   ------------------------------------------------------------------------------------------------------------------ No results for input(s): CHOL, HDL, LDLCALC, TRIG, CHOLHDL, LDLDIRECT in the last 72 hours.  No results found for: HGBA1C ------------------------------------------------------------------------------------------------------------------ No results for input(s): TSH, T4TOTAL, T3FREE, THYROIDAB in the last 72 hours.  Invalid input(s): FREET3  Cardiac Enzymes No results for input(s): CKMB, TROPONINI, MYOGLOBIN in the last 168 hours.  Invalid input(s): CK ------------------------------------------------------------------------------------------------------------------    Component Value Date/Time   BNP 185.0 (H) 02/22/2019 0345    Micro Results Recent Results (from the past 240 hour(s))  Blood culture (routine x 2)     Status: None (Preliminary result)   Collection Time: 2019/03/05  7:33 PM  Result Value Ref Range Status   Specimen Description BLOOD LEFT ANTECUBITAL  Final   Special Requests   Final    BOTTLES DRAWN AEROBIC AND ANAEROBIC Blood Culture results may not be optimal due to an inadequate volume of blood received in culture bottles   Culture   Final    NO GROWTH 2 DAYS Performed at [facility], [address] [place], [place] [address]    Report Status PENDING  Incomplete  Blood culture (routine x 2)     Status: None (Preliminary result)   Collection Time:  March 05, 2019  7:37 PM  Result Value Ref Range Status   Specimen Description BLOOD BLOOD RIGHT FOREARM  Final   Special Requests   Final    BOTTLES DRAWN AEROBIC AND ANAEROBIC Blood Culture adequate volume   Culture   Final    NO GROWTH 2 DAYS Performed at [facility], [address], [place], [place] [address]    Report Status PENDING  Incomplete  SARS Coronavirus 2 (CEPHEID- Performed in [facility] hospital lab), Hosp Order     Status: Abnormal   Collection Time: 03-05-19  8:04 PM  Result Value Ref Range Status   SARS Coronavirus 2 POSITIVE (A) NEGATIVE Final    Comment:  RESULT CALLED TO, READ BACK BY AND VERIFIED WITH: RN L CHILTON @ 2155 01/26/2019 BY S GEZAHEGN (NOTE) If result is NEGATIVE SARS-CoV-2 target nucleic acids are NOT DETECTED. The SARS-CoV-2 RNA is generally detectable in upper and lower  respiratory specimens during the acute phase of infection. The lowest  concentration of SARS-CoV-2 viral copies this assay can detect is 250  copies / mL. A negative result does not preclude SARS-CoV-2 infection  and should not be used as the sole basis for treatment or other  patient management decisions.  A negative result may occur with  improper specimen collection / handling, submission of specimen other  than nasopharyngeal swab, presence of viral mutation(s) within the  areas targeted by this assay, and inadequate number of viral copies  (<250 copies / mL). A negative result must be combined with clinical  observations, patient history, and epidemiological information. If result is POSITIVE SARS-CoV-2 target nucleic acids are DETECT ED. The SARS-CoV-2 RNA is generally detectable in upper and lower  respiratory specimens during the acute phase of infection.  Positive  results are indicative of active infection with SARS-CoV-2.  Clinical  correlation with patient history and other diagnostic information is  necessary to determine patient infection status.  Positive results  do  not rule out bacterial infection or co-infection with other viruses. If result is PRESUMPTIVE POSTIVE SARS-CoV-2 nucleic acids MAY BE PRESENT.   A presumptive positive result was obtained on the submitted specimen  and confirmed on repeat testing.  While 2019 novel coronavirus  (SARS-CoV-2) nucleic acids may be present in the submitted sample  additional confirmatory testing may be necessary for epidemiological  and / or clinical management purposes  to differentiate between  SARS-CoV-2 and other Sarbecovirus currently known to infect humans.  If clinically indicated additional testing with an alternate test  methodology (LAB74 53) is advised. The SARS-CoV-2 RNA is generally  detectable in upper and lower respiratory specimens during the acute  phase of infection. The expected result is Negative. Fact Sheet for Patients:  BoilerBrush.com.cyhttps://www.fda.gov/media/136312/download Fact Sheet for Healthcare Providers: https://pope.com/https://www.fda.gov/media/136313/download This test is not yet approved or cleared by the [place] FDA and has been authorized for detection and/or diagnosis of SARS-CoV-2 by FDA under an Emergency Use Authorization (EUA).  This EUA will remain in effect (meaning this test can be used) for the duration of the COVID-19 declaration under Section 564(b)(1) of the Act, 21 U.S.C. section 360bbb-3(b)(1), unless the authorization is terminated or revoked sooner. Performed at [facility], [address] [place], [place] [address]     Radiology Reports Dg Chest [place] 1 View  Result Date: 02/22/2019 CLINICAL DATA:  Shortness of breath, COVID-19 EXAM: PORTABLE CHEST 1 VIEW COMPARISON:  01/23/2019 FINDINGS: Multifocal patchy airspace opacities, improved. No definite pleural effusions. No pneumothorax. The heart is normal in size. IMPRESSION: Multifocal patchy airspace opacities, compatible with pneumonia, improved. Electronically Signed   By: Charline BillsSriyesh  Krishnan M.D.   On: 02/22/2019  08:42   Dg Chest Port 1 View  Result Date: 01/29/2019 CLINICAL DATA:  Shortness of breath EXAM: PORTABLE CHEST 1 VIEW COMPARISON:  None. FINDINGS: Diffuse bilateral airspace disease noted. Heart is normal size. No effusions. No acute bony abnormality. IMPRESSION: Diffuse bilateral airspace disease, likely infection. Electronically Signed   By: Charlett NoseKevin  Dover M.D.   On: 02/16/2019 22:36

## 2019-02-23 NOTE — Progress Notes (Signed)
Updated daughter Clarisse Gouge earlier in the shift via phone. Daughter expressed wishes that staff play Motown or similar music while in the patient's room. Patient and daughter spent much time listening to albums together. Answered questions that Clarisse Gouge had and informed her that she would be notified of any changes in patient condition. Daughter very appreciative and would like to FaceTime to see dad tomorrow if possible. Will inform day shift.

## 2019-02-23 DEATH — deceased

## 2019-02-24 ENCOUNTER — Other Ambulatory Visit: Payer: Self-pay

## 2019-02-24 LAB — INTERLEUKIN-6, PLASMA: Interleukin-6, Plasma: 239.7 pg/mL — ABNORMAL HIGH (ref 0.0–12.2)

## 2019-02-24 NOTE — Progress Notes (Addendum)
FaceTime Daughter Clarisse Gouge. She expressed that his siblings want to come see him and she informed them of the hospital restriction.   Writer noted, "[place], I understand this is a difficult time for you and your family. Reach out to his siblings, find out what time they can be available, Text me the times/names. I will try to meet the demand to facetime them.  I may not be able to facetime everyone, but I may be able to get at least one or two facetime in before end of shift."

## 2019-02-24 NOTE — Progress Notes (Signed)
Called Patton Salles and updated her on patient, all questions/ concerns answered at this time.

## 2019-02-24 NOTE — Progress Notes (Signed)
PROGRESS NOTE                                                                                                                                                                                                             Patient Demographics:    Thomas Mcgrath, is a 68 y.o. male, DOB - [birth year], YNW:[id number]  Outpatient Primary MD for the patient is Renford DillsPolite, [name], MD    LOS - 3  Admit date - 02/06/2019    Chief Complaint  Patient presents with  . Respiratory Distress       Brief Narrative Thomas Mcgrath is a 68 y.o. male with medical history significant for advanced dementia, seizure disorder, alcohol use who was brought [facility] the ED [facility] for [facility].  Patient is unable to provide any history due to respiratory distress therefore entirety of history is obtained from EDP, chart review, and daughter by phone.  Patient was apparently diagnosed with positive COVID-19 test at his facility and isolated from other residents.  Over the last 24 hours he had progressive shortness of breath, respiratory distress, and fever.  EMS were called today and he was found to have a oxygen saturation in the 40s which was improved with CPAP.  He is brought to the ED on nonrebreather with O2 sats in the mid to upper 80s.   Subjective:    Thomas Alaminobert Hinkson today in bed wearing NRB, remains on morphine drip and appears overall comfortable.  No signs of air hunger.   Assessment  & Plan :     1. Acute Hypoxic Resp. Failure due to Acute Covid 19 Viral Illness during the ongoing 2020 Covid 19 Pandemic -  In a patient with severe underlying dementia, appears to be in severe respiratory distress despite wearing nonrebreather mask, he is DNR, reviewed notes from last night and also discussed his case with patient's daughter in detail on 02/22/2019 & again on 02/24/19.  She confirms that he is DNR and agrees that patient should  be transition to comfort measures.  To new full comfort measures with IV morphine drip and supplemental oxygen as needed to keep him comfortable.  Goal of care remains comfort, most likely will pass away in the next 24 to 48 hours.    ABG     Component Value Date/Time  PHART 7.333 (L) 2019/03/22 2000   PCO2ART 26.1 (L) Mar 22, 2019 2000   PO2ART 89.0 03-22-2019 2000   HCO3 13.9 (L) Mar 22, 2019 2000   TCO2 15 (L) 03/22/19 2000   ACIDBASEDEF 10.0 (H) Mar 22, 2019 2000   O2SAT 96.0 03-22-2019 2000    COVID-19 Labs  Recent Labs    03/22/2019 2341 02/22/19 0345 02/23/19 0330  DDIMER 4.97* 8.24* 9.61*  FERRITIN 958* 1,040* 857*  LDH 513*  --   --   CRP 14.2* 16.4* 28.4*    Lab Results  Component Value Date   SARSCOV2NAA POSITIVE (A) 22-Mar-2019        Component Value Date/Time   BNP 185.0 (H) 02/22/2019 0345      2.  Severe dementia.  At risk for delirium.  PRN Haldol.  3.  Mildly elevated BNP with rails.  CHF nonspecific.  Not a candidate for urgent echocardiogram, trial of Lasix and monitor.  Currently n.p.o.  No old echo on file.  4.  Seizures.  Placed on Keppra IV.      Condition - Extremely Guarded  Family Communication  : daughter 02/22/19 & 02/24/19 - DNR & comfort care  Code Status :  DNR  Diet : NPO  Disposition Plan  : TBD  Consults  :  None  Procedures  :     PUD Prophylaxis :   DVT Prophylaxis  :   Heparin   Lab Results  Component Value Date   PLT 202 02/23/2019    Inpatient Medications  Scheduled Meds: . albuterol  2 puff Inhalation Q6H  . heparin  7,500 Units Subcutaneous Q8H  . sodium chloride flush  3 mL Intravenous Q12H  . thiamine injection  100 mg Intravenous Daily   Continuous Infusions: . sodium chloride 10 mL/hr at 02/23/19 1500  . levETIRAcetam 500 mg (02/24/19 0630)  . morphine 8 mg/hr (02/24/19 0630)   PRN Meds:.sodium chloride, acetaminophen **OR** acetaminophen, antiseptic oral rinse, atropine, bisacodyl,  chlorpheniramine-HYDROcodone, diltiazem, guaiFENesin-dextromethorphan, haloperidol **OR** haloperidol **OR** haloperidol lactate, hydrALAZINE, lip balm, LORazepam **OR** LORazepam **OR** LORazepam, morphine, ondansetron **OR** ondansetron (ZOFRAN) IV, polyvinyl alcohol  Antibiotics  :    Anti-infectives (From admission, onward)   Start     Dose/Rate Route Frequency Ordered Stop   03/22/19 2215  vancomycin (VANCOCIN) 1,500 mg in sodium chloride 0.9 % 500 mL IVPB     1,500 mg 250 mL/hr over 120 Minutes Intravenous  Once 03/22/19 2214 02/22/19 0340   2019/03/22 2215  piperacillin-tazobactam (ZOSYN) IVPB 3.375 g     3.375 g 100 mL/hr over 30 Minutes Intravenous  Once 22-Mar-2019 2214 March 22, 2019 2350       Time Spent in minutes  30   Susa Raring M.D on 02/24/2019 at 10:14 AM  To page go to [email or web address] - password [facility]  [facility] -  Office  [phone]     See all Orders from today for further details    Objective:   Vitals:   02/23/19 1558 02/23/19 1800 02/23/19 2114 02/24/19 0936  BP:      Pulse: (!) 109 (!) 111 (!) 112   Resp: (!) 27 15 (!) 23 12  Temp:   98.7 F (37.1 C)   TempSrc:   Axillary   SpO2: (!) 87% (!) 83% (!) 81% (!) 85%    Wt Readings from Last 3 Encounters:  No data found for Wt     Intake/Output Summary (Last 24 hours) at 02/24/2019 1014 Last data filed at 02/23/2019 1500 Gross per 24 hour  Intake 187.03 ml  Output -  Net 187.03 ml     Physical Exam  Patient in bed, much more calmer and comfortable on NRB mask and morphine drip. Minimal bibasilar Rales good air movement bilaterally Regular rate rhythm S1-S2 Abdomen soft No edema    Data Review:    CBC Recent Labs  Lab 02/12/2019 1936 01/25/2019 2000 02/22/19 0345 02/23/19 0330  WBC 11.3*  --  8.8 16.7*  HGB 13.7 13.6 12.4* 12.3*  HCT 44.0 40.0 40.0 39.7  PLT 169  --  127* 202  MCV 96.1  --  94.3 96.1  MCH 29.9  --  29.2 29.8  MCHC 31.1  --  31.0 31.0  RDW 14.1  --  14.1 14.6   LYMPHSABS 1.3  --  1.1 1.3  MONOABS 0.3  --  0.6 0.9  EOSABS 0.1  --  0.0 0.0  BASOSABS 0.0  --  0.0 0.0    Chemistries  Recent Labs  Lab 02/05/2019 1936 02/11/2019 2000 02/22/19 0345 02/23/19 0330  NA 140 143 144 143  K 5.5* 3.6 4.4 4.9  CL 106  --  111 109  CO2 15*  --  19* 25  GLUCOSE 174*  --  148* 190*  BUN 10  --  13 22  CREATININE 1.36*  --  1.37* 1.50*  CALCIUM 8.3*  --  7.9* 8.3*  MG  --   --  1.9 2.2  AST 77*  --  52* 47*  ALT 22  --  16 19  ALKPHOS 72  --  66 100  BILITOT 2.4*  --  0.8 0.5   ------------------------------------------------------------------------------------------------------------------ No results for input(s): CHOL, HDL, LDLCALC, TRIG, CHOLHDL, LDLDIRECT in the last 72 hours.  No results found for: HGBA1C ------------------------------------------------------------------------------------------------------------------ No results for input(s): TSH, T4TOTAL, T3FREE, THYROIDAB in the last 72 hours.  Invalid input(s): FREET3  Cardiac Enzymes No results for input(s): CKMB, TROPONINI, MYOGLOBIN in the last 168 hours.  Invalid input(s): CK ------------------------------------------------------------------------------------------------------------------    Component Value Date/Time   BNP 185.0 (H) 02/22/2019 0345    Micro Results Recent Results (from the past 240 hour(s))  Blood culture (routine x 2)     Status: None (Preliminary result)   Collection Time: 01/27/2019  7:33 PM  Result Value Ref Range Status   Specimen Description BLOOD LEFT ANTECUBITAL  Final   Special Requests   Final    BOTTLES DRAWN AEROBIC AND ANAEROBIC Blood Culture results may not be optimal due to an inadequate volume of blood received in culture bottles   Culture   Final    NO GROWTH 3 DAYS Performed at [facility], [address], [place], [place] [address]    Report Status PENDING  Incomplete  Blood culture (routine x 2)     Status: None (Preliminary result)    Collection Time: 02/16/2019  7:37 PM  Result Value Ref Range Status   Specimen Description BLOOD BLOOD RIGHT FOREARM  Final   Special Requests   Final    BOTTLES DRAWN AEROBIC AND ANAEROBIC Blood Culture adequate volume   Culture   Final    NO GROWTH 3 DAYS Performed at [facility], [address], [place], [place] [address]    Report Status PENDING  Incomplete  SARS Coronavirus 2 (CEPHEID- Performed in [facility] hospital lab), Hosp Order     Status: Abnormal   Collection Time: 02/20/2019  8:04 PM  Result Value Ref Range Status   SARS Coronavirus 2  POSITIVE (A) NEGATIVE Final    Comment: RESULT CALLED TO, READ BACK BY AND VERIFIED WITH: RN L CHILTON @ 2155 03-21-19 BY S GEZAHEGN (NOTE) If result is NEGATIVE SARS-CoV-2 target nucleic acids are NOT DETECTED. The SARS-CoV-2 RNA is generally detectable in upper and lower  respiratory specimens during the acute phase of infection. The lowest  concentration of SARS-CoV-2 viral copies this assay can detect is 250  copies / mL. A negative result does not preclude SARS-CoV-2 infection  and should not be used as the sole basis for treatment or other  patient management decisions.  A negative result may occur with  improper specimen collection / handling, submission of specimen other  than nasopharyngeal swab, presence of viral mutation(s) within the  areas targeted by this assay, and inadequate number of viral copies  (<250 copies / mL). A negative result must be combined with clinical  observations, patient history, and epidemiological information. If result is POSITIVE SARS-CoV-2 target nucleic acids are DETECT ED. The SARS-CoV-2 RNA is generally detectable in upper and lower  respiratory specimens during the acute phase of infection.  Positive  results are indicative of active infection with SARS-CoV-2.  Clinical  correlation with patient history and other diagnostic information is  necessary to determine patient infection status.   Positive results do  not rule out bacterial infection or co-infection with other viruses. If result is PRESUMPTIVE POSTIVE SARS-CoV-2 nucleic acids MAY BE PRESENT.   A presumptive positive result was obtained on the submitted specimen  and confirmed on repeat testing.  While 2019 novel coronavirus  (SARS-CoV-2) nucleic acids may be present in the submitted sample  additional confirmatory testing may be necessary for epidemiological  and / or clinical management purposes  to differentiate between  SARS-CoV-2 and other Sarbecovirus currently known to infect humans.  If clinically indicated additional testing with an alternate test  methodology (LAB74 53) is advised. The SARS-CoV-2 RNA is generally  detectable in upper and lower respiratory specimens during the acute  phase of infection. The expected result is Negative. Fact Sheet for Patients:  BoilerBrush.com.cy Fact Sheet for Healthcare Providers: https://pope.com/ This test is not yet approved or cleared by the [place] FDA and has been authorized for detection and/or diagnosis of SARS-CoV-2 by FDA under an Emergency Use Authorization (EUA).  This EUA will remain in effect (meaning this test can be used) for the duration of the COVID-19 declaration under Section 564(b)(1) of the Act, 21 U.S.C. section 360bbb-3(b)(1), unless the authorization is terminated or revoked sooner. Performed at [facility], [address] [place], [place] [address]     Radiology Reports Dg Chest [place] 1 View  Result Date: 02/22/2019 CLINICAL DATA:  Shortness of breath, COVID-19 EXAM: PORTABLE CHEST 1 VIEW COMPARISON:  03/21/2019 FINDINGS: Multifocal patchy airspace opacities, improved. No definite pleural effusions. No pneumothorax. The heart is normal in size. IMPRESSION: Multifocal patchy airspace opacities, compatible with pneumonia, improved. Electronically Signed   By: Charline Bills M.D.    On: 02/22/2019 08:42   Dg Chest Port 1 View  Result Date: 03/21/2019 CLINICAL DATA:  Shortness of breath EXAM: PORTABLE CHEST 1 VIEW COMPARISON:  None. FINDINGS: Diffuse bilateral airspace disease noted. Heart is normal size. No effusions. No acute bony abnormality. IMPRESSION: Diffuse bilateral airspace disease, likely infection. Electronically Signed   By: Charlett Nose M.D.   On: Mar 21, 2019 22:36

## 2019-02-24 NOTE — Progress Notes (Signed)
Daughter called and was appreciative for writer going the extra mile to let her father's siblings Facetime. She asked, "if you would please let my mother speak with my father, she don't have a smartphone, but she just want to say a few words to him."  Writer apologize, "I'm unable to assist with the phone call r/i have to see other patients. I will ask, night nurse if he/she is able to meet that request for you."  Daughter stated, "I would be greatly appreciative, if you ask the next nurse."  Writer later, called daughter and allow the ex-spouse to call back before shift change speak with patient. Writer informed her, he is unable to speak back to you.

## 2019-02-24 NOTE — Progress Notes (Signed)
Was able to let all his siblings facetime him today.

## 2019-02-24 NOTE — Plan of Care (Signed)
  Problem: Elimination: Goal: Will not experience complications related to bowel motility Outcome: Progressing   Problem: Pain Managment: Goal: General experience of comfort will improve Outcome: Progressing  Comfort care

## 2019-02-25 DIAGNOSIS — J9601 Acute respiratory failure with hypoxia: Secondary | ICD-10-CM | POA: Diagnosis present

## 2019-02-25 DIAGNOSIS — F0390 Unspecified dementia without behavioral disturbance: Secondary | ICD-10-CM | POA: Diagnosis present

## 2019-02-25 DIAGNOSIS — J129 Viral pneumonia, unspecified: Secondary | ICD-10-CM | POA: Diagnosis present

## 2019-02-26 LAB — CULTURE, BLOOD (ROUTINE X 2)
Culture: NO GROWTH
Culture: NO GROWTH
Special Requests: ADEQUATE

## 2019-03-25 NOTE — Progress Notes (Signed)
Remaining 75 mL morphine drip wasted in stericycle. Witnessed by Leta Baptist RN

## 2019-03-25 NOTE — Progress Notes (Signed)
Decrease in heart rate noted at 0910.  Asystole by [id number].  MD paged and made aware.  Pt DNR. 2nd nurse verified by [employer].  Patton Salles, daughter, notified of fathers passing.  Clarisse Gouge stated she would call back to the unit with funeral home preferences.  No concerns from daughter noted.  All questions answered.  [facility] notified.  Not a candidate for organ donation.

## 2019-03-25 NOTE — Discharge Summary (Addendum)
Death Summary  Thomas Mcgrath RRN:[id number] DOB: 1951/05/16 DOA: 03-06-2019  PCP: Renford Dills, MD  Admit date: 03-06-2019 Date of Death: 10-Mar-2019 Time of Death: 09:15  History of present illness:  Thomas Mcgrath is a 68 y.o. male with a history of advanced dementia, seizure disorder, alcohol use who is a resident of a skilled nursing facility, was brought to the emergency room with respiratory distress and sepsis secondary to COVID-19.  Patient was diagnosed with COVID-19 at his facility.  Over the 24 hours preceding admission, he became increasingly short of breath, had respiratory distress and developed fevers.  EMS was called and he was noted to have an oxygen saturation in the 40s which improved with CPAP.  He was placed on a nonrebreather in the emergency room which improved his saturations to upper 80s.  Noted that patient was DNR.  His chest x-ray indicated bilateral infiltrates, concerning for ARDS.  He was initially treated with steroids and received intermittent doses of Lasix.  Unfortunately, his overall condition did not improve.  Respiratory distress persisted and he eventually was placed on a morphine infusion which was titrated for comfort.  This was discussed with the family who also agreed with comfort measures.  Patient progressively declined.  On March 10, 2023 at 9:15 AM, patient was noted to be without pulse or respirations.  Family was informed of patient's death.   Final Diagnoses:  Principal Problem:   Acute respiratory disease due to COVID-19 virus Active Problems:   Seizure disorder (HCC)   Dementia without behavioral disturbance (HCC)   Acute respiratory failure with hypoxia (HCC)   Viral pneumonia   Sepsis    The results of significant diagnostics from this hospitalization (including imaging, microbiology, ancillary and laboratory) are listed below for reference.    Significant Diagnostic Studies: Dg Chest Port 1 View  Result Date: 02/22/2019 CLINICAL DATA:  Shortness of  breath, COVID-19 EXAM: PORTABLE CHEST 1 VIEW COMPARISON:  March 06, 2019 FINDINGS: Multifocal patchy airspace opacities, improved. No definite pleural effusions. No pneumothorax. The heart is normal in size. IMPRESSION: Multifocal patchy airspace opacities, compatible with pneumonia, improved. Electronically Signed   By: Charline Bills M.D.   On: 02/22/2019 08:42   Dg Chest Port 1 View  Result Date: 03-06-19 CLINICAL DATA:  Shortness of breath EXAM: PORTABLE CHEST 1 VIEW COMPARISON:  None. FINDINGS: Diffuse bilateral airspace disease noted. Heart is normal size. No effusions. No acute bony abnormality. IMPRESSION: Diffuse bilateral airspace disease, likely infection. Electronically Signed   By: Charlett Nose M.D.   On: Mar 06, 2019 22:36    Microbiology: Recent Results (from the past 240 hour(s))  Blood culture (routine x 2)     Status: None (Preliminary result)   Collection Time: 03-06-2019  7:33 PM  Result Value Ref Range Status   Specimen Description BLOOD LEFT ANTECUBITAL  Final   Special Requests   Final    BOTTLES DRAWN AEROBIC AND ANAEROBIC Blood Culture results may not be optimal due to an inadequate volume of blood received in culture bottles   Culture   Final    NO GROWTH 4 DAYS Performed at [facility], [address], [place], [place] [address]    Report Status PENDING  Incomplete  Blood culture (routine x 2)     Status: None (Preliminary result)   Collection Time: 2019-03-06  7:37 PM  Result Value Ref Range Status   Specimen Description BLOOD BLOOD RIGHT FOREARM  Final   Special Requests   Final    BOTTLES DRAWN AEROBIC  AND ANAEROBIC Blood Culture adequate volume   Culture   Final    NO GROWTH 4 DAYS Performed at [facility], [address], [place], [place] [address]    Report Status PENDING  Incomplete  SARS Coronavirus 2 (CEPHEID- Performed in [facility] hospital lab), Hosp Order     Status: Abnormal   Collection Time: 2019-03-07  8:04 PM  Result Value Ref  Range Status   SARS Coronavirus 2 POSITIVE (A) NEGATIVE Final    Comment: RESULT CALLED TO, READ BACK BY AND VERIFIED WITH: RN L CHILTON @ 2155 03-07-2019 BY S GEZAHEGN (NOTE) If result is NEGATIVE SARS-CoV-2 target nucleic acids are NOT DETECTED. The SARS-CoV-2 RNA is generally detectable in upper and lower  respiratory specimens during the acute phase of infection. The lowest  concentration of SARS-CoV-2 viral copies this assay can detect is 250  copies / mL. A negative result does not preclude SARS-CoV-2 infection  and should not be used as the sole basis for treatment or other  patient management decisions.  A negative result may occur with  improper specimen collection / handling, submission of specimen other  than nasopharyngeal swab, presence of viral mutation(s) within the  areas targeted by this assay, and inadequate number of viral copies  (<250 copies / mL). A negative result must be combined with clinical  observations, patient history, and epidemiological information. If result is POSITIVE SARS-CoV-2 target nucleic acids are DETECT ED. The SARS-CoV-2 RNA is generally detectable in upper and lower  respiratory specimens during the acute phase of infection.  Positive  results are indicative of active infection with SARS-CoV-2.  Clinical  correlation with patient history and other diagnostic information is  necessary to determine patient infection status.  Positive results do  not rule out bacterial infection or co-infection with other viruses. If result is PRESUMPTIVE POSTIVE SARS-CoV-2 nucleic acids MAY BE PRESENT.   A presumptive positive result was obtained on the submitted specimen  and confirmed on repeat testing.  While 2019 novel coronavirus  (SARS-CoV-2) nucleic acids may be present in the submitted sample  additional confirmatory testing may be necessary for epidemiological  and / or clinical management purposes  to differentiate between  SARS-CoV-2 and other  Sarbecovirus currently known to infect humans.  If clinically indicated additional testing with an alternate test  methodology (LAB74 53) is advised. The SARS-CoV-2 RNA is generally  detectable in upper and lower respiratory specimens during the acute  phase of infection. The expected result is Negative. Fact Sheet for Patients:  BoilerBrush.com.cy Fact Sheet for Healthcare Providers: https://pope.com/ This test is not yet approved or cleared by the [place] FDA and has been authorized for detection and/or diagnosis of SARS-CoV-2 by FDA under an Emergency Use Authorization (EUA).  This EUA will remain in effect (meaning this test can be used) for the duration of the COVID-19 declaration under Section 564(b)(1) of the Act, 21 U.S.C. section 360bbb-3(b)(1), unless the authorization is terminated or revoked sooner. Performed at [facility], [address], [place], [place] [address]      Labs: Basic Metabolic Panel: Recent Labs  Lab 03/07/19 1936 03/07/2019 2000 02/22/19 0345 02/23/19 0330  NA 140 143 144 143  K 5.5* 3.6 4.4 4.9  CL 106  --  111 109  CO2 15*  --  19* 25  GLUCOSE 174*  --  148* 190*  BUN 10  --  13 22  CREATININE 1.36*  --  1.37* 1.50*  CALCIUM  8.3*  --  7.9* 8.3*  MG  --   --  1.9 2.2  PHOS  --   --  3.5 4.4   Liver Function Tests: Recent Labs  Lab 02/06/2019 1936 02/22/19 0345 02/23/19 0330  AST 77* 52* 47*  ALT 22 16 19   ALKPHOS 72 66 100  BILITOT 2.4* 0.8 0.5  PROT 8.1 7.9 8.8*  ALBUMIN 3.2* 3.0* 3.3*   No results for input(s): LIPASE, AMYLASE in the last 168 hours. No results for input(s): AMMONIA in the last 168 hours. CBC: Recent Labs  Lab 01/31/2019 1936 02/09/2019 2000 02/22/19 0345 02/23/19 0330  WBC 11.3*  --  8.8 16.7*  NEUTROABS 9.7*  --  7.0 14.3*  HGB 13.7 13.6 12.4* 12.3*  HCT 44.0 40.0 40.0 39.7  MCV 96.1  --  94.3 96.1  PLT 169  --  127* 202   Cardiac Enzymes:  Recent Labs  Lab 02/22/19 0345 02/23/19 0330  CKTOTAL 361 327   D-Dimer Recent Labs    02/23/19 0330  DDIMER 9.61*   BNP: Invalid input(s): POCBNP CBG: Recent Labs  Lab 02/22/19 0334  GLUCAP 138*   Anemia work up Recent Labs    02/23/19 0330  FERRITIN 857*   Urinalysis No results found for: COLORURINE, APPEARANCEUR, LABSPEC, PHURINE, GLUCOSEU, HGBUR, BILIRUBINUR, KETONESUR, PROTEINUR, UROBILINOGEN, NITRITE, LEUKOCYTESUR Sepsis Labs Invalid input(s): PROCALCITONIN,  WBC,  LACTICIDVEN     SIGNED:  Erick BlinksJehanzeb Makinlee Awwad, MD  [facility] 03/16/2019, 5:01 PM Pager   If 7PM-7AM, please contact night-coverage [email or web address] Password [email or web address]

## 2019-03-25 DEATH — deceased

## 2020-03-18 IMAGING — CT CT CERVICAL SPINE W/O CM
4 of 7 series · 13 of 33 positions shown, 14 images · non-contrast
Comparison: None.

CLINICAL DATA: Unwitnessed fall. Possible seizure. History of
dementia. Initial encounter.

EXAM:
CT HEAD WITHOUT CONTRAST
CT CERVICAL SPINE WITHOUT CONTRAST
TECHNIQUE: Multidetector CT imaging of the head and cervical spine was
performed following the standard protocol without intravenous
contrast. Multiplanar CT image reconstructions of the cervical spine
were also generated.

[Series 11: orthogonal bone · axial · 0.23mm/px · z∈[-263,-174]mm · 4 of 88 slices shown, 5 images]
[im 18/88  soft-tissue]
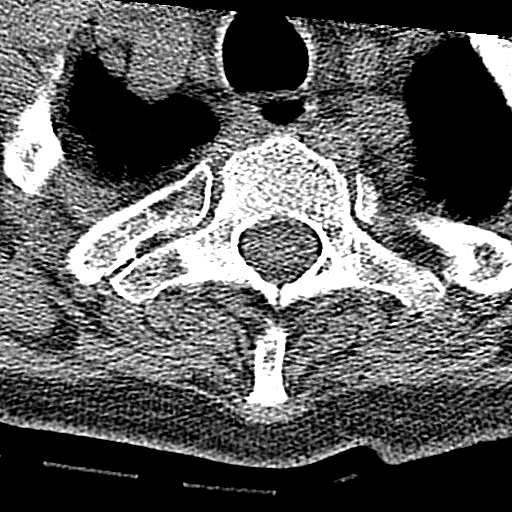
[im 18/88  bone]
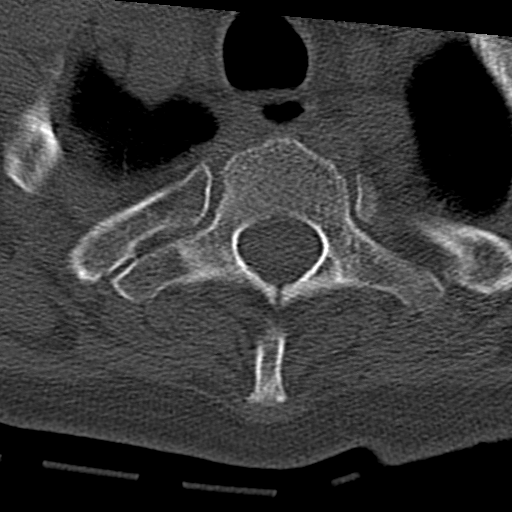
[im 35/88  bone]
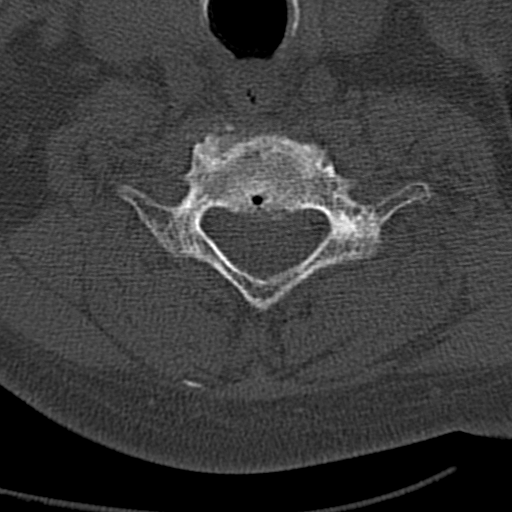
[im 53/88  bone]
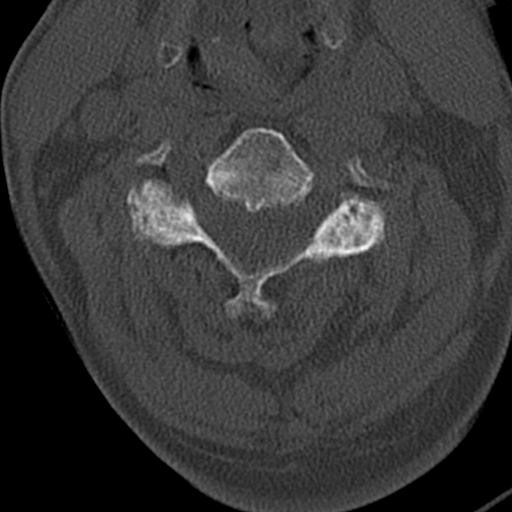
[im 70/88  bone]
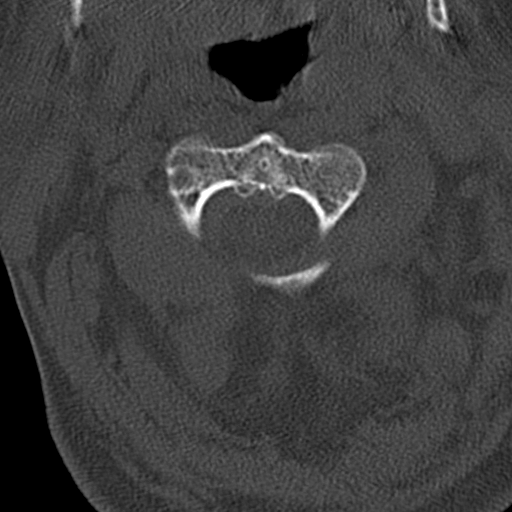

[Series 12: coronal bone · coronal · 0.23mm/px · 1 of 61 slices shown]
[im 31/61  bone]
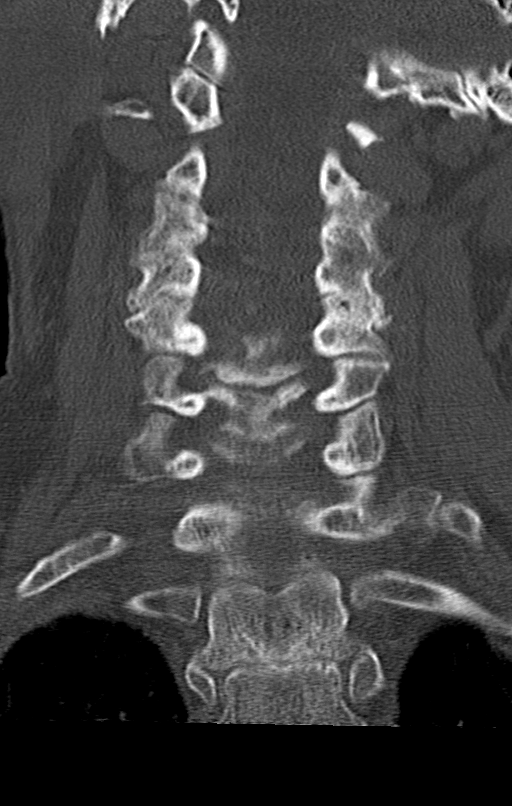

[Series 13: sagittal bone · sagittal · 0.23mm/px · 4 of 47 slices shown]
[im 10/47  bone]
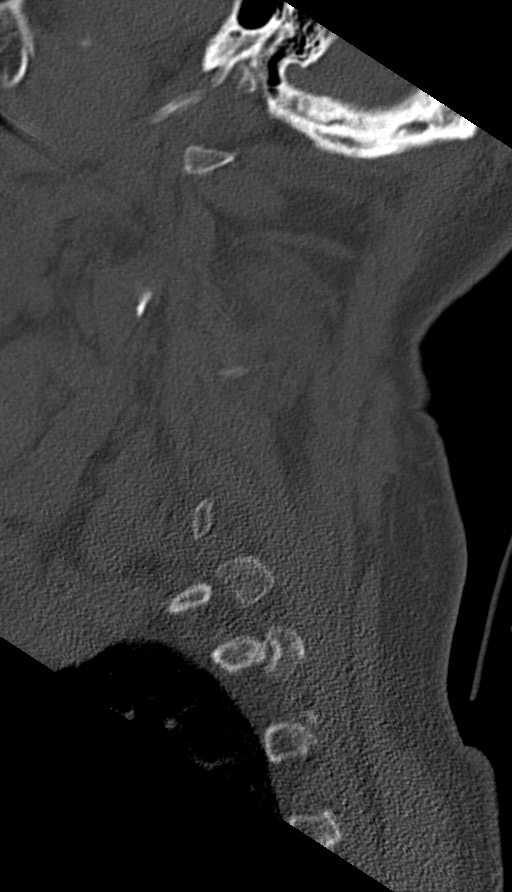
[im 19/47  bone]
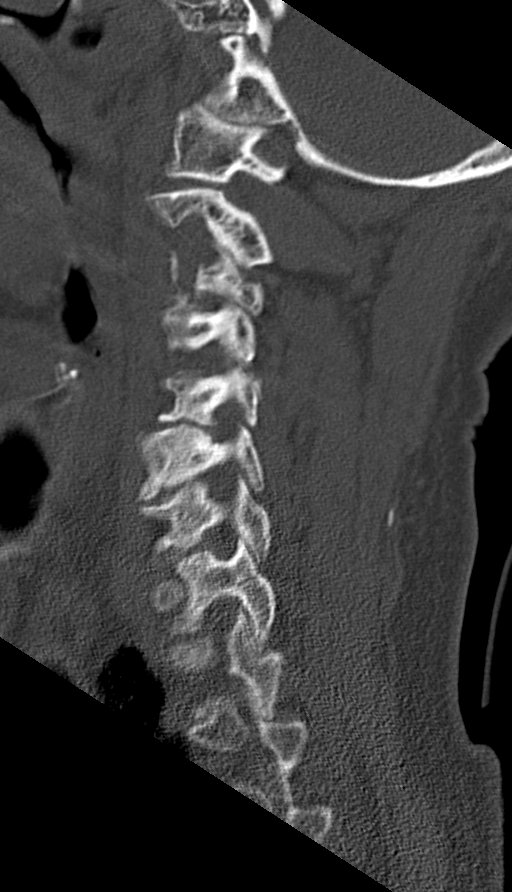
[im 28/47  bone]
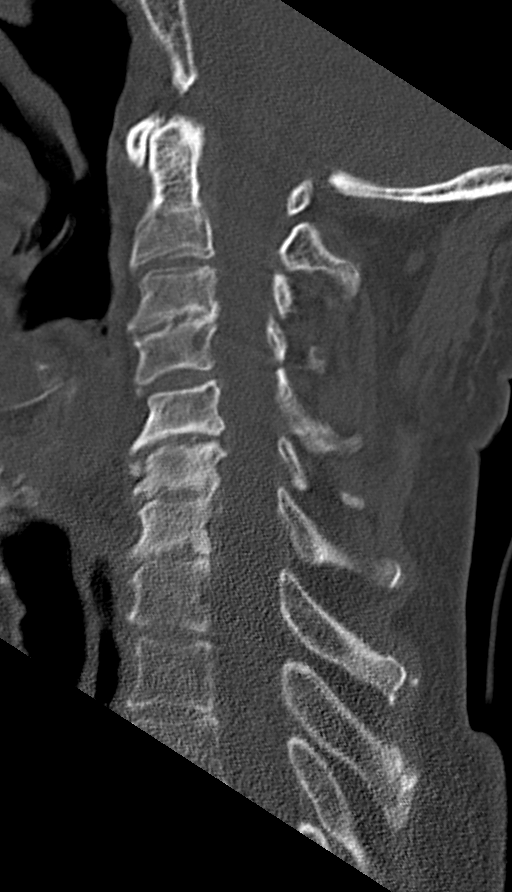
[im 37/47  bone]
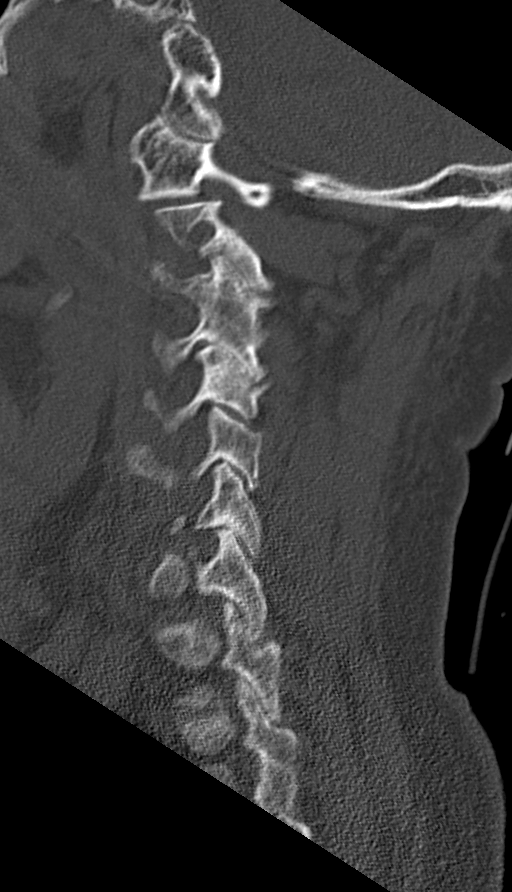

[Series 15: c spine soft · axial · 0.36mm/px · z∈[-230,-134]mm · 4 of 81 slices shown]
[im 17/81  soft-tissue]
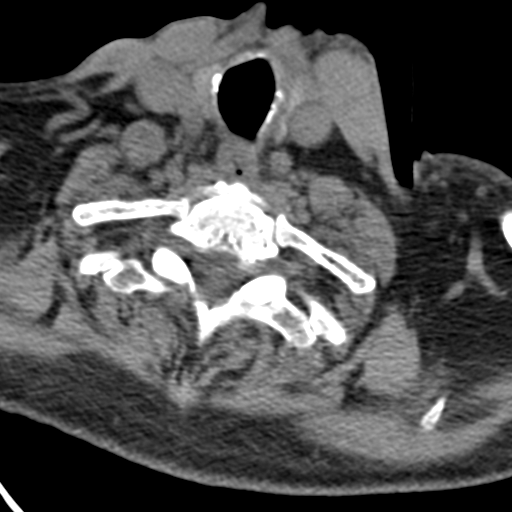
[im 33/81  soft-tissue]
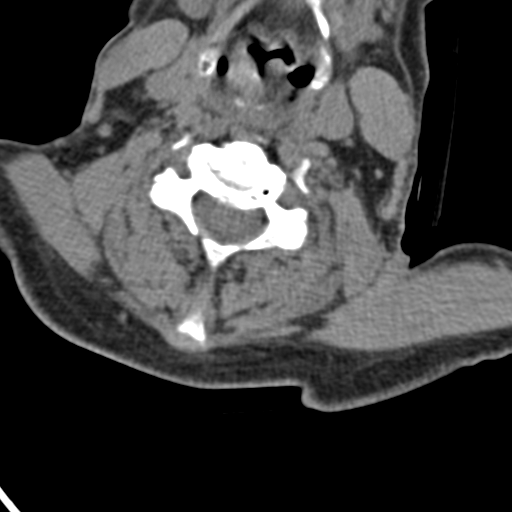
[im 49/81  soft-tissue]
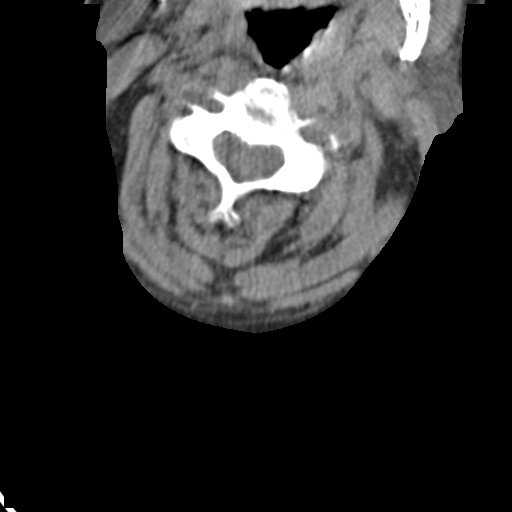
[im 65/81  soft-tissue]
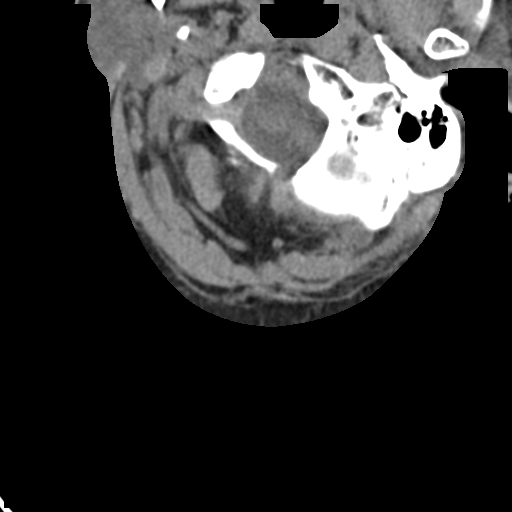

[13 of 33 positions shown; findings below may reference images not displayed]

FINDINGS: CT HEAD FINDINGS

Brain: There is rather extensive encephalomalacia in the right
temporal lobe and anterior frontal lobe with associated ex vacuo
dilatation of the frontal and temporal horns of the right lateral
ventricle. There is milder encephalomalacia in the anterior left
frontal lobe, also with left frontal horn ex vacuo enlargement. No
acute large territory infarct, intracranial hemorrhage, mass,
midline shift, or extra-axial fluid collection is identified.
Periventricular white matter hypodensities are nonspecific but
compatible with moderate chronic small vessel ischemic disease.

Vascular: Calcified atherosclerosis at the skull base. No hyperdense
vessel.

Skull: No fracture or focal osseous lesion.

Sinuses/Orbits: Visualized paranasal sinuses and mastoid air cells
are clear. Orbits are unremarkable.

Other: Mild right frontotemporal scalp swelling.

CT CERVICAL SPINE FINDINGS

Alignment: Cervical spine straightening.  No listhesis.

Skull base and vertebrae: No acute fracture identified within
limitations of mild motion artifact. No destructive osseous process.

Soft tissues and spinal canal: No prevertebral fluid or swelling. No
visible canal hematoma.

Disc levels: Multilevel disc degeneration with severe disc space
narrowing at C3-4 and moderate narrowing from C5-6 to C7-T1.
Degenerative endplate spurring and sclerosis at these levels. No
evidence of high-grade spinal or neural foraminal stenosis. Moderate
upper cervical facet arthrosis.

Upper chest: Mild paraseptal emphysema.

Other: Right greater than left carotid bifurcation calcified
atherosclerosis.
IMPRESSION: 1. No evidence of acute intracranial abnormality.
2. Extensive right frontal and right temporal lobe encephalomalacia
with milder left frontal encephalomalacia most suggestive of remote
trauma.
3. Moderate chronic small vessel ischemic disease.
4. Mildly motion degraded cervical spine CT without evidence of
acute fracture.

## 2021-05-30 IMAGING — DX PORTABLE CHEST - 1 VIEW
1 series · 1 of 1 positions shown · non-contrast
Comparison: 02/21/2019

CLINICAL DATA: Shortness of breath, [id number]

EXAM:
PORTABLE CHEST 1 VIEW

[chest]
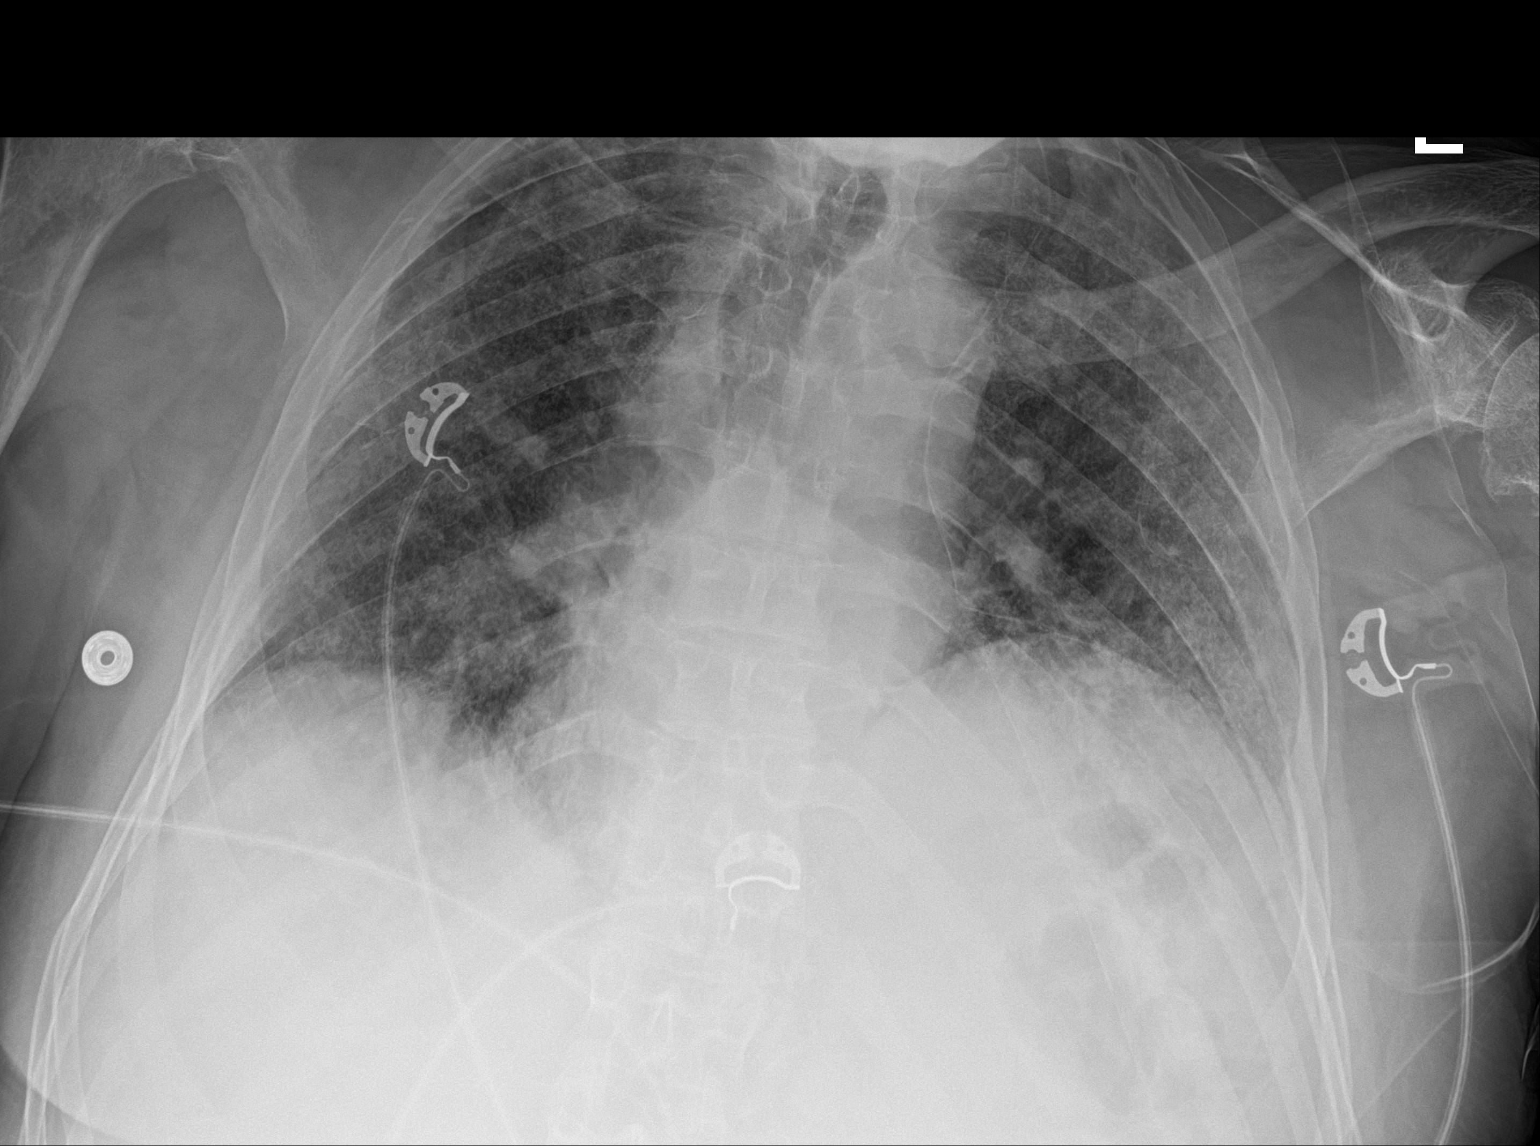

[1 of 1 positions shown; findings below may reference images not displayed]

FINDINGS: Multifocal patchy airspace opacities, improved. No definite pleural
effusions. No pneumothorax.

The heart is normal in size.
IMPRESSION: Multifocal patchy airspace opacities, compatible with pneumonia,
improved.
